# Patient Record
Sex: Female | Born: 1950 | Race: White | Hispanic: No | Marital: Married | Smoking: Never smoker
Health system: Southern US, Community
[De-identification: ages and names within clinical notes are randomized; demographics above are authoritative.]

## PROBLEM LIST (undated history)

## (undated) DIAGNOSIS — F32A Depression, unspecified: Secondary | ICD-10-CM

## (undated) DIAGNOSIS — T8859XA Other complications of anesthesia, initial encounter: Secondary | ICD-10-CM

## (undated) DIAGNOSIS — I10 Essential (primary) hypertension: Secondary | ICD-10-CM

## (undated) DIAGNOSIS — E119 Type 2 diabetes mellitus without complications: Secondary | ICD-10-CM

## (undated) DIAGNOSIS — N189 Chronic kidney disease, unspecified: Secondary | ICD-10-CM

## (undated) DIAGNOSIS — H269 Unspecified cataract: Secondary | ICD-10-CM

## (undated) DIAGNOSIS — C801 Malignant (primary) neoplasm, unspecified: Secondary | ICD-10-CM

## (undated) DIAGNOSIS — F419 Anxiety disorder, unspecified: Secondary | ICD-10-CM

## (undated) DIAGNOSIS — J189 Pneumonia, unspecified organism: Secondary | ICD-10-CM

## (undated) DIAGNOSIS — T4145XA Adverse effect of unspecified anesthetic, initial encounter: Secondary | ICD-10-CM

## (undated) DIAGNOSIS — K219 Gastro-esophageal reflux disease without esophagitis: Secondary | ICD-10-CM

## (undated) DIAGNOSIS — M1990 Unspecified osteoarthritis, unspecified site: Secondary | ICD-10-CM

## (undated) HISTORY — PX: CERVICAL DISC SURGERY: SHX588

## (undated) HISTORY — PX: CHOLECYSTECTOMY: SHX55

## (undated) HISTORY — PX: TONSILLECTOMY: SUR1361

## (undated) HISTORY — PX: OTHER SURGICAL HISTORY: SHX169

## (undated) HISTORY — PX: TUBAL LIGATION: SHX77

## (undated) HISTORY — DX: Malignant (primary) neoplasm, unspecified: C80.1

## (undated) HISTORY — DX: Unspecified cataract: H26.9

## (undated) HISTORY — DX: Gastro-esophageal reflux disease without esophagitis: K21.9

## (undated) HISTORY — PX: ABDOMINAL HYSTERECTOMY: SHX81

## (undated) HISTORY — DX: Depression, unspecified: F32.A

---

## 2004-02-07 HISTORY — PX: CARPAL TUNNEL RELEASE: SHX101

## 2006-12-17 ENCOUNTER — Inpatient Hospital Stay (HOSPITAL_COMMUNITY): Admission: RE | Admit: 2006-12-17 | Discharge: 2006-12-19 | Payer: Self-pay | Admitting: Neurosurgery

## 2007-11-14 ENCOUNTER — Encounter: Admission: RE | Admit: 2007-11-14 | Discharge: 2007-11-14 | Payer: Self-pay | Admitting: Neurosurgery

## 2007-12-30 ENCOUNTER — Ambulatory Visit (HOSPITAL_COMMUNITY): Admission: RE | Admit: 2007-12-30 | Discharge: 2007-12-31 | Payer: Self-pay | Admitting: Neurosurgery

## 2008-07-22 ENCOUNTER — Encounter: Admission: RE | Admit: 2008-07-22 | Discharge: 2008-07-22 | Payer: Self-pay | Admitting: Neurosurgery

## 2009-02-04 ENCOUNTER — Encounter: Admission: RE | Admit: 2009-02-04 | Discharge: 2009-02-04 | Payer: Self-pay | Admitting: Neurological Surgery

## 2009-04-22 HISTORY — PX: ESOPHAGOGASTRODUODENOSCOPY: SHX1529

## 2009-04-22 HISTORY — PX: COLONOSCOPY: SHX174

## 2010-06-21 NOTE — Op Note (Signed)
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   PROCEDURE:  C5-C7 posterior cervical arthrodesis with Oasis posterior  instrumentation, Actifuse, and Infuse.   SURGEON:  Hewitt Shorts, MD   ASSISTANTS:  Nelia Shi. Webb Silversmith, NP and Stefani Dama, MD   ANESTHESIA:  General endotracheal.   INDICATION:  The patient is a 60 year old woman, status post 3-level  anterior cervical diskectomy with fusion.  She has developed a good  fusion at C4-5, but nonunion and pseudoarthrosis at C5-6 and C6-7.  She  is having posterior neck pain with some discomfort extending down  towards her right shoulder.  The decision was made to proceed with a 2-  level posterior cervical arthrodesis.   PROCEDURE:  The patient was brought to the operating room, placed under  general endotracheal anesthesia.  The patient was placed on 3-pin  Mayfield head holder and turned to a prone position.  The back of the  neck was shaved and then the occipital, cervical, and upper thoracic  region were prepped with Betadine soap and solution, and draped in a  sterile fashion.  The midline incision was infiltrated with local  anesthetic with epinephrine.  The midline incision was made over the mid-  to-lower cervical spine.  A dissection was carried down to the  subcutaneous tissue.  Bipolar cautery and electrocautery was used to  maintain hemostasis.  Dissection was carried down to the cervical  fascia, which was incised bilaterally and the paraspinal  musculature was  dissected from the spinous process and lamina in a subperiosteal  fashion.  Self-retaining retractor was placed.  X-rays were taken and  the C5, C6, and C7 spinous process and lamina were identified.  With  magnification using microdissection and microsurgical technique, the  dissection was carried out laterally exposing the C5-6 and C6-7 facets.  There was no motion at C4-5, but there was notable motion at both C5-6  and C6-7.  We identified the lateral masses and then started drill holes  at each level bilaterally with an awl.  We then draped the C-arm  fluoroscope and using C-arm fluoroscopy, we were able to drill holes in  the lateral masses bilaterally at C5, C6, and C7, traversing them from  inferomedial to superolateral.  Each drill hole was examined with a ball  probe.  Good bony surfaces were noted on all surfaces at each of the  holes.  Each of the holes was then tapped and then we placed 12-mm  screws bilaterally at each level.  Once all 6 screws were in place, we  selected 40-mm rods.  They were lordosed  to the cervical lordosis and  then placed within the screw heads and secured with locking caps, which  were subsequently tightened against the counter torque.  We then  decorticated the lamina and spinous process of C5, C6, and C7 as well as  the fact joints at C5-6 and C6-7 bilaterally.  We then placed pledget of  Infuse over the lamina and facet joints bilaterally and then packed a 10-  mL volume of Actifuse, but splitting into half and placing about 5 mL on  each side.  Once the fusion construct was completed, we proceeded with  closure.  The deep fascia was closed with undyed 0 Vicryl sutures.  The  Scarpa, subcutaneous, and subcuticular were closed with interrupted  inverted 2-0 undyed Vicryl sutures.  The skin was closed with surgical  staples.  The wound was dressed with Adaptic, sterile gauze, and  Hypafix.  The procedure was tolerated well.  The  estimated  blood loss was 50 mL.  Sponge count was correct.  Following surgery, the  patient was turned back to the supine position.  The 3-pin Mayfield head  holder was removed.  The patient was reversed from the anesthetic,  extubated, and transferred to the recovery room for further care where  she was noted be moving all 4 extremities to command.      Hewitt Shorts, M.D.  Electronically Signed     RWN/MEDQ  D:  12/30/2007  T:  12/31/2007  Job:  [id number]

## 2010-06-21 NOTE — Op Note (Signed)
Paula Graves, Paula Graves NO.:  [id number]   MEDICAL RECORD NO.:  [id number]          PATIENT TYPE:  INP   LOCATION:  3013                         FACILITY:  [facility]   PHYSICIAN:  Hewitt Shorts, M.D.DATE OF BIRTH:  [birth year]   DATE OF PROCEDURE:  12/17/2006  DATE OF DISCHARGE:                               OPERATIVE REPORT   PREOPERATIVE DIAGNOSES:  1. Cervical stenosis.  2. Cervical spondylosis.  3. Cervical degenerative disk disease.  4. Cervical listhesis.   POSTOPERATIVE DIAGNOSES:  1. Cervical stenosis.  2. Cervical spondylosis.  3. Cervical degenerative disk disease.  4. Cervical listhesis.   PROCEDURE:  C4-5, C5-6 and C6-7 anterior cervical diskectomy arthrodesis  with allograft and tether cervical plating.   SURGEON:  Hewitt Shorts, M.D.   ASSISTANT:  Nelia Shi. Webb Silversmith, RN, and Coletta Memos, M.D.   ANESTHESIA:  General endotracheal.   INDICATIONS:  The patient is a 60 year old woman who presented with neck  pain and cervical radicular pain.  She was shown to have multilevel  degenerative changes at C4-5, C5-6 and C6-7.  The decision was made to  proceed with decompression and arthrodesis.   PROCEDURE:  The patient was brought to the operating room and placed  under general endotracheal anesthesia.  The patient was placed in 10  pounds of Holter traction.  The neck was prepped with Betadine soap  solution and draped in sterile fashion.  An oblique incision was made on  the left side of the neck paralleling the anterior border of the left  sternocleidomastoid.  The line of the incision was infiltrated with  local anesthetic with epinephrine.  Dissection was carried down through  the subcutaneous tissue and platysma.  Bipolar cautery and  electrocautery were used to maintain hemostasis.  Dissection was then  carried out through the avascular plane to the sternocleidomastoid,  carotid artery and jugular vein laterally, the trachea and  esophagus  medially.  The ventral aspect of the vertebral column was identified and  localizing X-ray were taken.  The C4-5, C5-6 and C6-7 levels identified.  Diskectomy was begun with incision of the anulus, continuing with  microcurettes and pituitary rongeurs.  The microscope was draped and  brought into the field to provide additional magnification, illumination  and visualization.  The rest of the decompression was performed using  microdissection and microsurgical technique.  Anterior osteophytic  overgrowth was removed using Leksell rongeur, osteophyte removal tool  and the ExMax drill.  We then continued the diskectomy posteriorly at  each level.  The cartilaginous endplates were removed using  microcurettes along with the ExMax drill.  Then posterior osteophytic  overgrowth, which was prominent at each level, was carefully removed  using the ExMax drill and a 2-mm Kerrison punch with a thin footplate.  The posterior longitudinal ligament was thickened at each level and this  was carefully removed as well.  We were able to decompress the spinal  canal and thecal sac.   We then turned our attention to the neural foraminal ensuring that each  of the neural foramina and the exiting  nerve roots were decompressed,  removing the spinal gutter growth encroaching on the neural foramina.  Once the neural foraminal and nerve roots were decompressed, hemostasis  was established with the use of Gelfoam soaked in thrombin.  We then  measured the height of the intervertebral disk spaces and selected three  7 mm in height grafts.  Each graft was hydrated in saline solution and  then we carefully positioned the intervertebral grafts at each level.  Once all three grafts were in place, we selected a 49-mm tether cervical  plate that was positioned over the fusion construct and secured to the  vertebra with 4 x 14 mm variable angle screws, placing a pair of screws  at C4, a pair of screws at C7,  single screws at C5 and C6.   Once the plate was secured and all 6 screws were finally tightened and x-  rays taken, we visualized only the C4 and C5 levels.  Screws were in  good position.  Her shoulders obscured vision at the other levels, but  under direct visualization they appeared in good position.  The wound  was irrigated with bacitracin solution, checked for hemostasis which was  established and confirmed and then we proceeded with closure.  The  platysma was closed with interrupted, inverted 2-0 Vicryl sutures, the  subcutaneous closed with inverted 3-0 Vicryl sutures.  The skin was  approximated with Dermabond.  The procedure was tolerated well.  The  estimated blood loss was 100 mL.  Sponge and needle counts were correct.  Following surgery the patient was placed in an Aspen cervical collar,  reversed from the anesthetic, extubated and transferred to the recovery  room for further care where she was noted to be moving all 4 extremities  to command.      Hewitt Shorts, M.D.  Electronically Signed     RWN/MEDQ  D:  12/17/2006  T:  12/17/2006  Job:  [id number]

## 2010-11-08 LAB — CBC
HCT: 42.3
Hemoglobin: 14.4
MCHC: 33.9
MCV: 93.6
Platelets: 270
RBC: 4.52
RDW: 12.5
WBC: 10.6 — ABNORMAL HIGH

## 2010-11-15 LAB — CBC
HCT: 41.8
Hemoglobin: 14.2
MCHC: 34
MCV: 92
Platelets: 297
RBC: 4.55
RDW: 12.8
WBC: 8.7

## 2014-09-09 DIAGNOSIS — Z0000 Encounter for general adult medical examination without abnormal findings: Secondary | ICD-10-CM | POA: Diagnosis not present

## 2014-09-10 DIAGNOSIS — Z23 Encounter for immunization: Secondary | ICD-10-CM | POA: Diagnosis not present

## 2014-09-10 DIAGNOSIS — E782 Mixed hyperlipidemia: Secondary | ICD-10-CM | POA: Diagnosis not present

## 2014-09-10 DIAGNOSIS — Z79899 Other long term (current) drug therapy: Secondary | ICD-10-CM | POA: Diagnosis not present

## 2014-09-18 DIAGNOSIS — Z1231 Encounter for screening mammogram for malignant neoplasm of breast: Secondary | ICD-10-CM | POA: Diagnosis not present

## 2014-11-25 DIAGNOSIS — Z23 Encounter for immunization: Secondary | ICD-10-CM | POA: Diagnosis not present

## 2015-05-21 DIAGNOSIS — J309 Allergic rhinitis, unspecified: Secondary | ICD-10-CM | POA: Diagnosis not present

## 2015-05-21 DIAGNOSIS — J0100 Acute maxillary sinusitis, unspecified: Secondary | ICD-10-CM | POA: Diagnosis not present

## 2015-05-24 DIAGNOSIS — J40 Bronchitis, not specified as acute or chronic: Secondary | ICD-10-CM | POA: Diagnosis not present

## 2015-05-24 DIAGNOSIS — J329 Chronic sinusitis, unspecified: Secondary | ICD-10-CM | POA: Diagnosis not present

## 2015-05-31 DIAGNOSIS — R062 Wheezing: Secondary | ICD-10-CM | POA: Diagnosis not present

## 2015-05-31 DIAGNOSIS — J209 Acute bronchitis, unspecified: Secondary | ICD-10-CM | POA: Diagnosis not present

## 2015-06-08 DIAGNOSIS — M7501 Adhesive capsulitis of right shoulder: Secondary | ICD-10-CM | POA: Diagnosis not present

## 2015-08-28 DIAGNOSIS — S91332A Puncture wound without foreign body, left foot, initial encounter: Secondary | ICD-10-CM | POA: Diagnosis not present

## 2015-10-20 DIAGNOSIS — E782 Mixed hyperlipidemia: Secondary | ICD-10-CM | POA: Diagnosis not present

## 2015-10-20 DIAGNOSIS — I10 Essential (primary) hypertension: Secondary | ICD-10-CM | POA: Diagnosis not present

## 2015-10-20 DIAGNOSIS — Z0000 Encounter for general adult medical examination without abnormal findings: Secondary | ICD-10-CM | POA: Diagnosis not present

## 2015-10-20 DIAGNOSIS — Z79899 Other long term (current) drug therapy: Secondary | ICD-10-CM | POA: Diagnosis not present

## 2015-10-20 DIAGNOSIS — E1165 Type 2 diabetes mellitus with hyperglycemia: Secondary | ICD-10-CM | POA: Diagnosis not present

## 2015-11-05 DIAGNOSIS — R030 Elevated blood-pressure reading, without diagnosis of hypertension: Secondary | ICD-10-CM | POA: Diagnosis not present

## 2015-11-05 DIAGNOSIS — M4722 Other spondylosis with radiculopathy, cervical region: Secondary | ICD-10-CM | POA: Diagnosis not present

## 2015-11-05 DIAGNOSIS — Z6827 Body mass index (BMI) 27.0-27.9, adult: Secondary | ICD-10-CM | POA: Diagnosis not present

## 2015-11-05 DIAGNOSIS — M542 Cervicalgia: Secondary | ICD-10-CM | POA: Diagnosis not present

## 2015-11-05 DIAGNOSIS — M5030 Other cervical disc degeneration, unspecified cervical region: Secondary | ICD-10-CM | POA: Diagnosis not present

## 2015-11-05 DIAGNOSIS — M5412 Radiculopathy, cervical region: Secondary | ICD-10-CM | POA: Diagnosis not present

## 2015-11-11 DIAGNOSIS — M4802 Spinal stenosis, cervical region: Secondary | ICD-10-CM | POA: Diagnosis not present

## 2015-11-11 DIAGNOSIS — M4722 Other spondylosis with radiculopathy, cervical region: Secondary | ICD-10-CM | POA: Diagnosis not present

## 2015-11-25 DIAGNOSIS — M4722 Other spondylosis with radiculopathy, cervical region: Secondary | ICD-10-CM | POA: Diagnosis not present

## 2015-11-25 DIAGNOSIS — M5020 Other cervical disc displacement, unspecified cervical region: Secondary | ICD-10-CM | POA: Diagnosis not present

## 2015-11-25 DIAGNOSIS — M5030 Other cervical disc degeneration, unspecified cervical region: Secondary | ICD-10-CM | POA: Diagnosis not present

## 2015-11-25 DIAGNOSIS — Z6827 Body mass index (BMI) 27.0-27.9, adult: Secondary | ICD-10-CM | POA: Diagnosis not present

## 2015-11-25 DIAGNOSIS — I10 Essential (primary) hypertension: Secondary | ICD-10-CM | POA: Diagnosis not present

## 2015-11-25 DIAGNOSIS — M5412 Radiculopathy, cervical region: Secondary | ICD-10-CM | POA: Diagnosis not present

## 2015-11-25 DIAGNOSIS — M542 Cervicalgia: Secondary | ICD-10-CM | POA: Diagnosis not present

## 2015-12-20 DIAGNOSIS — Z1231 Encounter for screening mammogram for malignant neoplasm of breast: Secondary | ICD-10-CM | POA: Diagnosis not present

## 2015-12-20 DIAGNOSIS — Z23 Encounter for immunization: Secondary | ICD-10-CM | POA: Diagnosis not present

## 2016-01-10 DIAGNOSIS — R928 Other abnormal and inconclusive findings on diagnostic imaging of breast: Secondary | ICD-10-CM | POA: Diagnosis not present

## 2016-01-28 DIAGNOSIS — M5030 Other cervical disc degeneration, unspecified cervical region: Secondary | ICD-10-CM | POA: Diagnosis not present

## 2016-01-28 DIAGNOSIS — M4722 Other spondylosis with radiculopathy, cervical region: Secondary | ICD-10-CM | POA: Diagnosis not present

## 2016-01-28 DIAGNOSIS — M5020 Other cervical disc displacement, unspecified cervical region: Secondary | ICD-10-CM | POA: Diagnosis not present

## 2016-01-28 DIAGNOSIS — M542 Cervicalgia: Secondary | ICD-10-CM | POA: Diagnosis not present

## 2016-01-28 DIAGNOSIS — M5412 Radiculopathy, cervical region: Secondary | ICD-10-CM | POA: Diagnosis not present

## 2016-03-14 DIAGNOSIS — M4722 Other spondylosis with radiculopathy, cervical region: Secondary | ICD-10-CM | POA: Diagnosis not present

## 2016-03-14 DIAGNOSIS — M5020 Other cervical disc displacement, unspecified cervical region: Secondary | ICD-10-CM | POA: Diagnosis not present

## 2016-03-14 DIAGNOSIS — M5030 Other cervical disc degeneration, unspecified cervical region: Secondary | ICD-10-CM | POA: Diagnosis not present

## 2016-03-14 DIAGNOSIS — I10 Essential (primary) hypertension: Secondary | ICD-10-CM | POA: Diagnosis not present

## 2016-03-14 DIAGNOSIS — M5412 Radiculopathy, cervical region: Secondary | ICD-10-CM | POA: Diagnosis not present

## 2016-03-14 DIAGNOSIS — M542 Cervicalgia: Secondary | ICD-10-CM | POA: Diagnosis not present

## 2016-03-14 DIAGNOSIS — M4802 Spinal stenosis, cervical region: Secondary | ICD-10-CM | POA: Diagnosis not present

## 2016-03-14 DIAGNOSIS — Z6827 Body mass index (BMI) 27.0-27.9, adult: Secondary | ICD-10-CM | POA: Diagnosis not present

## 2016-05-29 DIAGNOSIS — L820 Inflamed seborrheic keratosis: Secondary | ICD-10-CM | POA: Diagnosis not present

## 2016-05-29 DIAGNOSIS — C44722 Squamous cell carcinoma of skin of right lower limb, including hip: Secondary | ICD-10-CM | POA: Diagnosis not present

## 2016-05-29 DIAGNOSIS — L821 Other seborrheic keratosis: Secondary | ICD-10-CM | POA: Diagnosis not present

## 2016-05-29 DIAGNOSIS — D2271 Melanocytic nevi of right lower limb, including hip: Secondary | ICD-10-CM | POA: Diagnosis not present

## 2016-05-29 DIAGNOSIS — L578 Other skin changes due to chronic exposure to nonionizing radiation: Secondary | ICD-10-CM | POA: Diagnosis not present

## 2016-05-29 DIAGNOSIS — L570 Actinic keratosis: Secondary | ICD-10-CM | POA: Diagnosis not present

## 2016-10-12 DIAGNOSIS — E1165 Type 2 diabetes mellitus with hyperglycemia: Secondary | ICD-10-CM | POA: Diagnosis not present

## 2016-10-19 DIAGNOSIS — E119 Type 2 diabetes mellitus without complications: Secondary | ICD-10-CM | POA: Diagnosis not present

## 2016-10-19 DIAGNOSIS — K589 Irritable bowel syndrome without diarrhea: Secondary | ICD-10-CM | POA: Diagnosis not present

## 2016-10-19 DIAGNOSIS — Z23 Encounter for immunization: Secondary | ICD-10-CM | POA: Diagnosis not present

## 2016-11-16 DIAGNOSIS — E119 Type 2 diabetes mellitus without complications: Secondary | ICD-10-CM | POA: Diagnosis not present

## 2016-11-20 DIAGNOSIS — Z6825 Body mass index (BMI) 25.0-25.9, adult: Secondary | ICD-10-CM | POA: Diagnosis not present

## 2016-11-20 DIAGNOSIS — R300 Dysuria: Secondary | ICD-10-CM | POA: Diagnosis not present

## 2016-11-20 DIAGNOSIS — N12 Tubulo-interstitial nephritis, not specified as acute or chronic: Secondary | ICD-10-CM | POA: Diagnosis not present

## 2016-11-21 DIAGNOSIS — R300 Dysuria: Secondary | ICD-10-CM | POA: Diagnosis not present

## 2016-12-07 DIAGNOSIS — N2581 Secondary hyperparathyroidism of renal origin: Secondary | ICD-10-CM | POA: Diagnosis not present

## 2016-12-07 DIAGNOSIS — D631 Anemia in chronic kidney disease: Secondary | ICD-10-CM | POA: Diagnosis not present

## 2016-12-07 DIAGNOSIS — N183 Chronic kidney disease, stage 3 (moderate): Secondary | ICD-10-CM | POA: Diagnosis not present

## 2016-12-07 DIAGNOSIS — I10 Essential (primary) hypertension: Secondary | ICD-10-CM | POA: Diagnosis not present

## 2016-12-12 ENCOUNTER — Other Ambulatory Visit: Payer: Self-pay | Admitting: Neurosurgery

## 2016-12-12 DIAGNOSIS — M4722 Other spondylosis with radiculopathy, cervical region: Secondary | ICD-10-CM | POA: Diagnosis not present

## 2016-12-12 DIAGNOSIS — M5020 Other cervical disc displacement, unspecified cervical region: Secondary | ICD-10-CM | POA: Diagnosis not present

## 2016-12-12 DIAGNOSIS — M542 Cervicalgia: Secondary | ICD-10-CM | POA: Diagnosis not present

## 2016-12-12 DIAGNOSIS — M5030 Other cervical disc degeneration, unspecified cervical region: Secondary | ICD-10-CM | POA: Diagnosis not present

## 2016-12-12 DIAGNOSIS — M4802 Spinal stenosis, cervical region: Secondary | ICD-10-CM | POA: Diagnosis not present

## 2016-12-13 DIAGNOSIS — R300 Dysuria: Secondary | ICD-10-CM | POA: Diagnosis not present

## 2016-12-14 DIAGNOSIS — R300 Dysuria: Secondary | ICD-10-CM | POA: Diagnosis not present

## 2016-12-15 DIAGNOSIS — D631 Anemia in chronic kidney disease: Secondary | ICD-10-CM | POA: Diagnosis not present

## 2016-12-15 DIAGNOSIS — N2581 Secondary hyperparathyroidism of renal origin: Secondary | ICD-10-CM | POA: Diagnosis not present

## 2016-12-15 DIAGNOSIS — N281 Cyst of kidney, acquired: Secondary | ICD-10-CM | POA: Diagnosis not present

## 2016-12-15 DIAGNOSIS — N183 Chronic kidney disease, stage 3 (moderate): Secondary | ICD-10-CM | POA: Diagnosis not present

## 2016-12-15 DIAGNOSIS — I129 Hypertensive chronic kidney disease with stage 1 through stage 4 chronic kidney disease, or unspecified chronic kidney disease: Secondary | ICD-10-CM | POA: Diagnosis not present

## 2016-12-18 ENCOUNTER — Encounter (HOSPITAL_COMMUNITY): Payer: Self-pay | Admitting: *Deleted

## 2016-12-18 ENCOUNTER — Other Ambulatory Visit: Payer: Self-pay

## 2016-12-18 ENCOUNTER — Encounter (HOSPITAL_COMMUNITY)
Admission: RE | Admit: 2016-12-18 | Discharge: 2016-12-18 | Disposition: A | Discharge status: Home / Self Care | Payer: Medicare Other | Source: Ambulatory Visit | Attending: Neurosurgery | Admitting: Neurosurgery

## 2016-12-18 DIAGNOSIS — E119 Type 2 diabetes mellitus without complications: Secondary | ICD-10-CM | POA: Insufficient documentation

## 2016-12-18 DIAGNOSIS — I10 Essential (primary) hypertension: Secondary | ICD-10-CM | POA: Insufficient documentation

## 2016-12-18 DIAGNOSIS — M1990 Unspecified osteoarthritis, unspecified site: Secondary | ICD-10-CM

## 2016-12-18 DIAGNOSIS — M5022 Other cervical disc displacement, mid-cervical region, unspecified level: Secondary | ICD-10-CM | POA: Insufficient documentation

## 2016-12-18 DIAGNOSIS — N189 Chronic kidney disease, unspecified: Secondary | ICD-10-CM | POA: Diagnosis not present

## 2016-12-18 DIAGNOSIS — E1122 Type 2 diabetes mellitus with diabetic chronic kidney disease: Secondary | ICD-10-CM | POA: Diagnosis not present

## 2016-12-18 DIAGNOSIS — Z7984 Long term (current) use of oral hypoglycemic drugs: Secondary | ICD-10-CM | POA: Insufficient documentation

## 2016-12-18 DIAGNOSIS — Z01810 Encounter for preprocedural cardiovascular examination: Secondary | ICD-10-CM | POA: Insufficient documentation

## 2016-12-18 DIAGNOSIS — F419 Anxiety disorder, unspecified: Secondary | ICD-10-CM | POA: Diagnosis not present

## 2016-12-18 DIAGNOSIS — M5011 Cervical disc disorder with radiculopathy,  high cervical region: Secondary | ICD-10-CM | POA: Diagnosis not present

## 2016-12-18 DIAGNOSIS — Z01812 Encounter for preprocedural laboratory examination: Secondary | ICD-10-CM

## 2016-12-18 DIAGNOSIS — I129 Hypertensive chronic kidney disease with stage 1 through stage 4 chronic kidney disease, or unspecified chronic kidney disease: Secondary | ICD-10-CM | POA: Diagnosis not present

## 2016-12-18 DIAGNOSIS — N289 Disorder of kidney and ureter, unspecified: Secondary | ICD-10-CM

## 2016-12-18 DIAGNOSIS — Z79899 Other long term (current) drug therapy: Secondary | ICD-10-CM | POA: Diagnosis not present

## 2016-12-18 HISTORY — DX: Pneumonia, unspecified organism: J18.9

## 2016-12-18 HISTORY — DX: Type 2 diabetes mellitus without complications: E11.9

## 2016-12-18 HISTORY — DX: Essential (primary) hypertension: I10

## 2016-12-18 HISTORY — DX: Unspecified osteoarthritis, unspecified site: M19.90

## 2016-12-18 HISTORY — DX: Chronic kidney disease, unspecified: N18.9

## 2016-12-18 HISTORY — DX: Adverse effect of unspecified anesthetic, initial encounter: T41.45XA

## 2016-12-18 HISTORY — DX: Anxiety disorder, unspecified: F41.9

## 2016-12-18 HISTORY — DX: Other complications of anesthesia, initial encounter: T88.59XA

## 2016-12-18 LAB — CBC
HCT: 36.2 % (ref 36.0–46.0)
Hemoglobin: 11.8 g/dL — ABNORMAL LOW (ref 12.0–15.0)
MCH: 30.5 pg (ref 26.0–34.0)
MCHC: 32.6 g/dL (ref 30.0–36.0)
MCV: 93.5 fL (ref 78.0–100.0)
PLATELETS: 335 10*3/uL (ref 150–400)
RBC: 3.87 MIL/uL (ref 3.87–5.11)
RDW: 12.8 % (ref 11.5–15.5)
WBC: 8 10*3/uL (ref 4.0–10.5)

## 2016-12-18 LAB — BASIC METABOLIC PANEL
Anion gap: 9 (ref 5–15)
BUN: 15 mg/dL (ref 6–20)
CO2: 27 mmol/L (ref 22–32)
Calcium: 9.8 mg/dL (ref 8.9–10.3)
Chloride: 103 mmol/L (ref 101–111)
Creatinine, Ser: 1.57 mg/dL — ABNORMAL HIGH (ref 0.44–1.00)
GFR calc Af Amer: 38 mL/min — ABNORMAL LOW (ref >60)
GFR, EST NON AFRICAN AMERICAN: 33 mL/min — AB (ref >60)
Glucose, Bld: 157 mg/dL — ABNORMAL HIGH (ref 65–99)
POTASSIUM: 4.1 mmol/L (ref 3.5–5.1)
SODIUM: 139 mmol/L (ref 135–145)

## 2016-12-18 LAB — TYPE AND SCREEN
ABO/RH(D): A POS
Antibody Screen: NEGATIVE

## 2016-12-18 LAB — GLUCOSE, CAPILLARY: Glucose-Capillary: 185 mg/dL — ABNORMAL HIGH (ref 65–99)

## 2016-12-18 LAB — HEMOGLOBIN A1C
Hgb A1c MFr Bld: 6.7 % — ABNORMAL HIGH (ref 4.8–5.6)
MEAN PLASMA GLUCOSE: 145.59 mg/dL

## 2016-12-18 LAB — SURGICAL PCR SCREEN
MRSA, PCR: NEGATIVE
Staphylococcus aureus: NEGATIVE

## 2016-12-18 LAB — ABO/RH: ABO/RH(D): A POS

## 2016-12-18 NOTE — Pre-Procedure Instructions (Addendum)
Paula Graves  12/18/2016      [facility] [place], [place] - [address] AT [address] [place] [address] Phone: [phone] Fax: [phone]    Your procedure is scheduled on 12/20/16.  Report to [facility] at [place].M.  Call this number if you have problems the morning of surgery:  [phone]   Remember:  Do not eat food or drink liquids after midnight.  Take these medicines the morning of surgery with A SIP OF WATER        Venlafaxine(effexor), lorazepam(ativan) if needed, flexeril if needed, imitrex if needed  STOP all herbel meds, nsaids (aleve,naproxen,advil,ibuprofen) prior to surgery starting today 12/18/16 including all vitamins/supplements,aspirin ,fish oil,osteo-biflex   Do not wear jewelry, make-up or nail polish.  Do not wear lotions, powders, or perfumes, or deoderant.  Do not shave 48 hours prior to surgery.  Men may shave face and neck.  Do not bring valuables to the hospital.  [facility] is not responsible for any belongings or valuables.  Contacts, dentures or bridgework may not be worn into surgery.  Leave your suitcase in the car.  After surgery it may be brought to your room.  For patients admitted to the hospital, discharge time will be determined by your treatment team.  Patients discharged the day of surgery will not be allowed to drive home.   Special instructions:   Special Instructions: [place] - Preparing for Surgery  Before surgery, you can play an important role.  Because skin is not sterile, your skin needs to be as free of germs as possible.  You can reduce the number of germs on you skin by washing with CHG (chlorahexidine gluconate) soap before surgery.  CHG is an antiseptic cleaner which kills germs and bonds with the skin to continue killing germs even after washing.  Please DO NOT use if you have an allergy to CHG or antibacterial  soaps.  If your skin becomes reddened/irritated stop using the CHG and inform your nurse when you arrive at Short Stay.  Do not shave (including legs and underarms) for at least 48 hours prior to the first CHG shower.  You may shave your face.  Please follow these instructions carefully:   1.  Shower with CHG Soap the night before surgery and the morning of Surgery.  2.  If you choose to wash your hair, wash your hair first as usual with your normal shampoo.  3.  After you shampoo, rinse your hair and body thoroughly to remove the Shampoo.  4.  Use CHG as you would any other liquid soap.  You can apply chg directly  to the skin and wash gently with scrungie or a clean washcloth.  5.  Apply the CHG Soap to your body ONLY FROM THE NECK DOWN.  Do not use on open wounds or open sores.  Avoid contact with your eyes ears, mouth and genitals (private parts).  Wash genitals (private parts)       with your normal soap.  6.  Wash thoroughly, paying special attention to the area where your surgery will be performed.  7.  Thoroughly rinse your body with warm water from the neck down.  8.  DO NOT shower/wash with your normal soap after using and rinsing off the CHG Soap.  9.  Pat yourself dry with a clean towel.  10.  Wear clean pajamas.            11.  Place clean sheets on your bed the night of your first shower and do not sleep with pets.  Day of Surgery  Do not apply any lotions/deodorants the morning of surgery.  Please wear clean clothes to the hospital/surgery center.  Please read over the  fact sheets that you were given.

## 2016-12-19 NOTE — Progress Notes (Signed)
Anesthesia Chart Review:  Pt is a 66 year old female scheduled for partial removal tethered cervical plate, ACDF C2-3, C3-4 on 12/20/2016 with Jovita Gamma, M.D.  - Nephrologist is Elmarie Shiley, MD. Last office visit 12/07/16. Notes indicate Dr. Posey Pronto took pt off of metformin and HCTZ in September due to worsening renal function and did not replace them with alternative treatments.  Dr. Sherwood Gambler is aware per [facility] in his office.   PMH includes: HTN, DM, CKD. Never smoker. BMI 27.  Medications include: Simvastatin  BP (!) 180/89   Pulse 81   Temp 36.8 C (Oral)   Resp 18   Ht 5\' 2"  (1.575 m)   Wt 148 lb 2 oz (67.2 kg)   SpO2 100%   BMI 27.09 kg/m   Preoperative labs reviewed.   - HbA1c 6.7, glucose 157  EKG 12/18/16: NSR  Renal ultrasound 12/15/16 [facility]):  1. Question mild cortical thinning at the upper poles of both kidneys. No hydronephrosis 2. 2.8cm simple right parapelvic cyst, similar to previous CT from 2014.   If BP and blood glucose are acceptable day of surgery, I anticipate pt can proceed as scheduled.   Willeen Cass, FNP-BC [facility] Short Stay Surgical Center/Anesthesiology Phone: [phone] 12/19/2016 3:16 PM

## 2016-12-20 ENCOUNTER — Ambulatory Visit (HOSPITAL_COMMUNITY): Payer: Medicare Other | Admitting: Emergency Medicine

## 2016-12-20 ENCOUNTER — Observation Stay (HOSPITAL_COMMUNITY)
Admission: RE | Admit: 2016-12-20 | Discharge: 2016-12-21 | Disposition: A | Discharge status: Home / Self Care | Payer: Medicare Other | Source: Ambulatory Visit | Attending: Neurosurgery | Admitting: Neurosurgery

## 2016-12-20 ENCOUNTER — Encounter (HOSPITAL_COMMUNITY)
Admission: RE | Disposition: A | Discharge status: Home / Self Care | Payer: Self-pay | Source: Ambulatory Visit | Attending: Neurosurgery

## 2016-12-20 ENCOUNTER — Observation Stay (HOSPITAL_COMMUNITY): Payer: Medicare Other

## 2016-12-20 ENCOUNTER — Encounter (HOSPITAL_COMMUNITY): Payer: Self-pay | Admitting: *Deleted

## 2016-12-20 DIAGNOSIS — F419 Anxiety disorder, unspecified: Secondary | ICD-10-CM | POA: Diagnosis not present

## 2016-12-20 DIAGNOSIS — I10 Essential (primary) hypertension: Secondary | ICD-10-CM | POA: Diagnosis not present

## 2016-12-20 DIAGNOSIS — N189 Chronic kidney disease, unspecified: Secondary | ICD-10-CM | POA: Diagnosis not present

## 2016-12-20 DIAGNOSIS — E119 Type 2 diabetes mellitus without complications: Secondary | ICD-10-CM | POA: Diagnosis not present

## 2016-12-20 DIAGNOSIS — E1122 Type 2 diabetes mellitus with diabetic chronic kidney disease: Secondary | ICD-10-CM | POA: Insufficient documentation

## 2016-12-20 DIAGNOSIS — Z79899 Other long term (current) drug therapy: Secondary | ICD-10-CM | POA: Insufficient documentation

## 2016-12-20 DIAGNOSIS — Z419 Encounter for procedure for purposes other than remedying health state, unspecified: Secondary | ICD-10-CM

## 2016-12-20 DIAGNOSIS — M1990 Unspecified osteoarthritis, unspecified site: Secondary | ICD-10-CM | POA: Diagnosis not present

## 2016-12-20 DIAGNOSIS — M4722 Other spondylosis with radiculopathy, cervical region: Secondary | ICD-10-CM | POA: Diagnosis not present

## 2016-12-20 DIAGNOSIS — M5020 Other cervical disc displacement, unspecified cervical region: Secondary | ICD-10-CM | POA: Diagnosis present

## 2016-12-20 DIAGNOSIS — M5011 Cervical disc disorder with radiculopathy,  high cervical region: Secondary | ICD-10-CM | POA: Diagnosis not present

## 2016-12-20 DIAGNOSIS — I129 Hypertensive chronic kidney disease with stage 1 through stage 4 chronic kidney disease, or unspecified chronic kidney disease: Secondary | ICD-10-CM | POA: Insufficient documentation

## 2016-12-20 DIAGNOSIS — Z981 Arthrodesis status: Secondary | ICD-10-CM | POA: Diagnosis not present

## 2016-12-20 DIAGNOSIS — M5021 Other cervical disc displacement,  high cervical region: Secondary | ICD-10-CM | POA: Diagnosis not present

## 2016-12-20 HISTORY — PX: ANTERIOR CERVICAL DECOMP/DISCECTOMY FUSION: SHX1161

## 2016-12-20 LAB — GLUCOSE, CAPILLARY
Glucose-Capillary: 167 mg/dL — ABNORMAL HIGH (ref 65–99)
Glucose-Capillary: 232 mg/dL — ABNORMAL HIGH (ref 65–99)
Glucose-Capillary: 262 mg/dL — ABNORMAL HIGH (ref 65–99)
Glucose-Capillary: 214 mg/dL — ABNORMAL HIGH (ref 65–99)

## 2016-12-20 SURGERY — ANTERIOR CERVICAL DECOMPRESSION/DISCECTOMY FUSION 1 LEVEL/HARDWARE REMOVAL
Anesthesia: General | Site: Neck

## 2016-12-20 MED ORDER — LACTATED RINGERS IV SOLN
INTRAVENOUS | Status: DC | PRN
  Administered 2016-12-20 (×2): via INTRAVENOUS

## 2016-12-20 MED ORDER — BISACODYL 10 MG RE SUPP: 10.0000 mg | Freq: Every day | RECTAL | Status: DC | PRN

## 2016-12-20 MED ORDER — PROPOFOL 10 MG/ML IV BOLUS
INTRAVENOUS | Status: DC | PRN
  Administered 2016-12-20: 150 mg via INTRAVENOUS

## 2016-12-20 MED ORDER — LABETALOL HCL 5 MG/ML IV SOLN
INTRAVENOUS | Status: AC
  Filled 2016-12-20: qty 4

## 2016-12-20 MED ORDER — PROPOFOL 10 MG/ML IV BOLUS
INTRAVENOUS | Status: AC
  Filled 2016-12-20: qty 20

## 2016-12-20 MED ORDER — INSULIN ASPART 100 UNIT/ML ~~LOC~~ SOLN
0.0000 [IU] | Freq: Every day | SUBCUTANEOUS | Status: DC
  Administered 2016-12-20: 2 [IU] via SUBCUTANEOUS

## 2016-12-20 MED ORDER — HYDROCODONE-ACETAMINOPHEN 5-325 MG PO TABS
1.0000 | ORAL_TABLET | ORAL | Status: DC | PRN
  Administered 2016-12-20 (×2): 1 via ORAL
  Administered 2016-12-21 (×4): 2 via ORAL
  Filled 2016-12-20: qty 1
  Filled 2016-12-20: qty 2
  Filled 2016-12-20: qty 1
  Filled 2016-12-20 (×3): qty 2

## 2016-12-20 MED ORDER — ACETAMINOPHEN 650 MG RE SUPP: 650.0000 mg | RECTAL | Status: DC | PRN

## 2016-12-20 MED ORDER — SODIUM CHLORIDE 0.9% FLUSH: 3.0000 mL | INTRAVENOUS | Status: DC | PRN

## 2016-12-20 MED ORDER — VENLAFAXINE HCL 75 MG PO TABS
75.0000 mg | ORAL_TABLET | Freq: Three times a day (TID) | ORAL | Status: DC
  Administered 2016-12-20 – 2016-12-21 (×3): 75 mg via ORAL
  Filled 2016-12-20 (×5): qty 1

## 2016-12-20 MED ORDER — CEFAZOLIN SODIUM-DEXTROSE 2-4 GM/100ML-% IV SOLN
INTRAVENOUS | Status: AC
  Filled 2016-12-20: qty 100

## 2016-12-20 MED ORDER — KCL IN DEXTROSE-NACL 20-5-0.45 MEQ/L-%-% IV SOLN
INTRAVENOUS | Status: AC
  Filled 2016-12-20: qty 1000

## 2016-12-20 MED ORDER — THROMBIN (RECOMBINANT) 5000 UNITS EX SOLR
CUTANEOUS | Status: AC
  Filled 2016-12-20: qty 10000

## 2016-12-20 MED ORDER — VITAMIN C 500 MG PO TABS
1000.0000 mg | ORAL_TABLET | Freq: Every day | ORAL | Status: DC
  Administered 2016-12-21: 1000 mg via ORAL
  Filled 2016-12-20 (×2): qty 2

## 2016-12-20 MED ORDER — ONDANSETRON HCL 4 MG PO TABS
4.0000 mg | ORAL_TABLET | Freq: Four times a day (QID) | ORAL | Status: DC | PRN
  Administered 2016-12-20: 4 mg via ORAL
  Filled 2016-12-20: qty 1

## 2016-12-20 MED ORDER — DICYCLOMINE HCL 20 MG PO TABS
20.0000 mg | ORAL_TABLET | Freq: Four times a day (QID) | ORAL | Status: DC | PRN
  Filled 2016-12-20: qty 1

## 2016-12-20 MED ORDER — MAGNESIUM HYDROXIDE 400 MG/5ML PO SUSP: 30.0000 mL | Freq: Every day | ORAL | Status: DC | PRN

## 2016-12-20 MED ORDER — PHENYLEPHRINE 40 MCG/ML (10ML) SYRINGE FOR IV PUSH (FOR BLOOD PRESSURE SUPPORT)
PREFILLED_SYRINGE | INTRAVENOUS | Status: AC
  Filled 2016-12-20: qty 10

## 2016-12-20 MED ORDER — PROMETHAZINE HCL 25 MG/ML IJ SOLN: 6.2500 mg | INTRAMUSCULAR | Status: DC | PRN

## 2016-12-20 MED ORDER — OXYCODONE HCL 5 MG/5ML PO SOLN: 5.0000 mg | Freq: Once | ORAL | Status: DC | PRN

## 2016-12-20 MED ORDER — OXYCODONE HCL 5 MG PO TABS
ORAL_TABLET | ORAL | Status: AC
  Filled 2016-12-20: qty 1

## 2016-12-20 MED ORDER — DEXAMETHASONE SODIUM PHOSPHATE 10 MG/ML IJ SOLN
INTRAMUSCULAR | Status: DC | PRN
  Administered 2016-12-20: 5 mg via INTRAVENOUS

## 2016-12-20 MED ORDER — ALUM & MAG HYDROXIDE-SIMETH 200-200-20 MG/5ML PO SUSP: 30.0000 mL | Freq: Four times a day (QID) | ORAL | Status: DC | PRN

## 2016-12-20 MED ORDER — ACETAMINOPHEN 10 MG/ML IV SOLN
INTRAVENOUS | Status: DC | PRN
  Administered 2016-12-20: 1000 mg via INTRAVENOUS

## 2016-12-20 MED ORDER — BUPIVACAINE HCL (PF) 0.5 % IJ SOLN
INTRAMUSCULAR | Status: DC | PRN
  Administered 2016-12-20: 5 mL

## 2016-12-20 MED ORDER — DEXAMETHASONE SODIUM PHOSPHATE 10 MG/ML IJ SOLN
INTRAMUSCULAR | Status: AC
  Filled 2016-12-20: qty 3

## 2016-12-20 MED ORDER — ROCURONIUM BROMIDE 10 MG/ML (PF) SYRINGE
PREFILLED_SYRINGE | INTRAVENOUS | Status: DC | PRN
  Administered 2016-12-20: 50 mg via INTRAVENOUS

## 2016-12-20 MED ORDER — KCL IN DEXTROSE-NACL 20-5-0.45 MEQ/L-%-% IV SOLN
INTRAVENOUS | Status: DC
  Administered 2016-12-20: 100 mL/h via INTRAVENOUS

## 2016-12-20 MED ORDER — SODIUM CHLORIDE 0.9 % IJ SOLN
INTRAMUSCULAR | Status: AC
  Filled 2016-12-20: qty 10

## 2016-12-20 MED ORDER — MIDAZOLAM HCL 5 MG/5ML IJ SOLN
INTRAMUSCULAR | Status: DC | PRN
  Administered 2016-12-20: 2 mg via INTRAVENOUS

## 2016-12-20 MED ORDER — HYDROXYZINE HCL 50 MG/ML IM SOLN: 50.0000 mg | INTRAMUSCULAR | Status: DC | PRN

## 2016-12-20 MED ORDER — LIDOCAINE 2% (20 MG/ML) 5 ML SYRINGE
INTRAMUSCULAR | Status: DC | PRN
  Administered 2016-12-20: 80 mg via INTRAVENOUS

## 2016-12-20 MED ORDER — EPHEDRINE 5 MG/ML INJ
INTRAVENOUS | Status: AC
Start: 2016-12-20 — End: 2016-12-20
  Filled 2016-12-20: qty 10

## 2016-12-20 MED ORDER — ONDANSETRON HCL 4 MG/2ML IJ SOLN
INTRAMUSCULAR | Status: DC | PRN
  Administered 2016-12-20: 4 mg via INTRAVENOUS

## 2016-12-20 MED ORDER — OXYCODONE HCL 5 MG PO TABS: 5.0000 mg | ORAL_TABLET | Freq: Once | ORAL | Status: DC | PRN

## 2016-12-20 MED ORDER — SIMVASTATIN 40 MG PO TABS
40.0000 mg | ORAL_TABLET | Freq: Every day | ORAL | Status: DC
  Administered 2016-12-20: 40 mg via ORAL
  Filled 2016-12-20: qty 2
  Filled 2016-12-20 (×2): qty 1

## 2016-12-20 MED ORDER — ROCURONIUM BROMIDE 10 MG/ML (PF) SYRINGE
PREFILLED_SYRINGE | INTRAVENOUS | Status: AC
  Filled 2016-12-20: qty 25

## 2016-12-20 MED ORDER — ACETAMINOPHEN 325 MG PO TABS: 650.0000 mg | ORAL_TABLET | ORAL | Status: DC | PRN

## 2016-12-20 MED ORDER — HEMOSTATIC AGENTS (NO CHARGE) OPTIME
TOPICAL | Status: DC | PRN
  Administered 2016-12-20 (×2): 1 via TOPICAL

## 2016-12-20 MED ORDER — THROMBIN (RECOMBINANT) 5000 UNITS EX SOLR
CUTANEOUS | Status: DC | PRN
  Administered 2016-12-20 (×3): 5000 [IU] via TOPICAL

## 2016-12-20 MED ORDER — CHLORHEXIDINE GLUCONATE CLOTH 2 % EX PADS: 6.0000 | MEDICATED_PAD | Freq: Once | CUTANEOUS | Status: DC

## 2016-12-20 MED ORDER — MIDAZOLAM HCL 2 MG/2ML IJ SOLN
INTRAMUSCULAR | Status: AC
  Filled 2016-12-20: qty 2

## 2016-12-20 MED ORDER — MORPHINE SULFATE (PF) 4 MG/ML IV SOLN
4.0000 mg | INTRAVENOUS | Status: DC | PRN
  Administered 2016-12-20: 4 mg via INTRAMUSCULAR
  Filled 2016-12-20: qty 1

## 2016-12-20 MED ORDER — ONDANSETRON HCL 4 MG/2ML IJ SOLN
4.0000 mg | Freq: Four times a day (QID) | INTRAMUSCULAR | Status: DC | PRN
  Filled 2016-12-20: qty 2

## 2016-12-20 MED ORDER — PHENOL 1.4 % MT LIQD
1.0000 | OROMUCOSAL | Status: DC | PRN
  Administered 2016-12-20: 1 via OROMUCOSAL
  Filled 2016-12-20: qty 177

## 2016-12-20 MED ORDER — HYDROMORPHONE HCL 1 MG/ML IJ SOLN
INTRAMUSCULAR | Status: AC
  Administered 2016-12-20: 0.5 mg via INTRAVENOUS
  Filled 2016-12-20: qty 1

## 2016-12-20 MED ORDER — 0.9 % SODIUM CHLORIDE (POUR BTL) OPTIME
TOPICAL | Status: DC | PRN
  Administered 2016-12-20: 1000 mL

## 2016-12-20 MED ORDER — LORAZEPAM 0.5 MG PO TABS: 1.0000 mg | ORAL_TABLET | Freq: Two times a day (BID) | ORAL | Status: DC | PRN

## 2016-12-20 MED ORDER — HYDROMORPHONE HCL 1 MG/ML IJ SOLN
0.2500 mg | INTRAMUSCULAR | Status: DC | PRN
  Administered 2016-12-20: 0.5 mg via INTRAVENOUS

## 2016-12-20 MED ORDER — BUPIVACAINE HCL (PF) 0.5 % IJ SOLN
INTRAMUSCULAR | Status: AC
  Filled 2016-12-20: qty 30

## 2016-12-20 MED ORDER — SODIUM CHLORIDE 0.9 % IV SOLN: 250.0000 mL | INTRAVENOUS | Status: DC

## 2016-12-20 MED ORDER — MENTHOL 3 MG MT LOZG: 1.0000 | LOZENGE | OROMUCOSAL | Status: DC | PRN

## 2016-12-20 MED ORDER — SUGAMMADEX SODIUM 200 MG/2ML IV SOLN
INTRAVENOUS | Status: DC | PRN
  Administered 2016-12-20: 100 mg via INTRAVENOUS

## 2016-12-20 MED ORDER — THROMBIN (RECOMBINANT) 5000 UNITS EX SOLR
CUTANEOUS | Status: AC
  Filled 2016-12-20: qty 5000

## 2016-12-20 MED ORDER — ONDANSETRON HCL 4 MG/2ML IJ SOLN
INTRAMUSCULAR | Status: AC
  Filled 2016-12-20: qty 8

## 2016-12-20 MED ORDER — ONDANSETRON HCL 4 MG/2ML IJ SOLN
INTRAMUSCULAR | Status: AC
  Filled 2016-12-20: qty 2

## 2016-12-20 MED ORDER — SODIUM CHLORIDE 0.9 % IR SOLN
Status: DC | PRN
  Administered 2016-12-20: 10:00:00

## 2016-12-20 MED ORDER — LABETALOL HCL 5 MG/ML IV SOLN
INTRAVENOUS | Status: DC | PRN
  Administered 2016-12-20: 5 mg via INTRAVENOUS
  Administered 2016-12-20: 10 mg via INTRAVENOUS

## 2016-12-20 MED ORDER — INSULIN ASPART 100 UNIT/ML ~~LOC~~ SOLN
0.0000 [IU] | Freq: Three times a day (TID) | SUBCUTANEOUS | Status: DC
  Administered 2016-12-21 (×2): 3 [IU] via SUBCUTANEOUS

## 2016-12-20 MED ORDER — FLEET ENEMA 7-19 GM/118ML RE ENEM: 1.0000 | ENEMA | Freq: Once | RECTAL | Status: DC | PRN

## 2016-12-20 MED ORDER — SUFENTANIL CITRATE 50 MCG/ML IV SOLN
INTRAVENOUS | Status: AC
  Filled 2016-12-20: qty 1

## 2016-12-20 MED ORDER — LIDOCAINE-EPINEPHRINE 1 %-1:100000 IJ SOLN
INTRAMUSCULAR | Status: AC
  Filled 2016-12-20: qty 1

## 2016-12-20 MED ORDER — CEFAZOLIN SODIUM-DEXTROSE 2-4 GM/100ML-% IV SOLN
2.0000 g | INTRAVENOUS | Status: AC
  Administered 2016-12-20: 2 g via INTRAVENOUS

## 2016-12-20 MED ORDER — SUFENTANIL CITRATE 50 MCG/ML IV SOLN
INTRAVENOUS | Status: DC | PRN
  Administered 2016-12-20: 20 ug via INTRAVENOUS

## 2016-12-20 MED ORDER — SUMATRIPTAN SUCCINATE 100 MG PO TABS
100.0000 mg | ORAL_TABLET | ORAL | Status: DC | PRN
  Filled 2016-12-20: qty 1

## 2016-12-20 MED ORDER — LIDOCAINE-EPINEPHRINE 1 %-1:100000 IJ SOLN
INTRAMUSCULAR | Status: DC | PRN
  Administered 2016-12-20: 5 mL

## 2016-12-20 MED ORDER — MEPERIDINE HCL 25 MG/ML IJ SOLN: 6.2500 mg | INTRAMUSCULAR | Status: DC | PRN

## 2016-12-20 MED ORDER — LIDOCAINE 2% (20 MG/ML) 5 ML SYRINGE
INTRAMUSCULAR | Status: AC
  Filled 2016-12-20: qty 5

## 2016-12-20 MED ORDER — SODIUM CHLORIDE 0.9% FLUSH: 3.0000 mL | Freq: Two times a day (BID) | INTRAVENOUS | Status: DC

## 2016-12-20 MED ORDER — HYDROXYZINE HCL 25 MG PO TABS: 50.0000 mg | ORAL_TABLET | ORAL | Status: DC | PRN

## 2016-12-20 MED ORDER — SUGAMMADEX SODIUM 200 MG/2ML IV SOLN
INTRAVENOUS | Status: AC
  Filled 2016-12-20: qty 2

## 2016-12-20 MED ORDER — CYCLOBENZAPRINE HCL 10 MG PO TABS
ORAL_TABLET | ORAL | Status: AC
  Administered 2016-12-20: 5 mg via ORAL
  Filled 2016-12-20: qty 1

## 2016-12-20 MED ORDER — CYCLOBENZAPRINE HCL 5 MG PO TABS
5.0000 mg | ORAL_TABLET | Freq: Three times a day (TID) | ORAL | Status: DC | PRN
  Administered 2016-12-20: 10 mg via ORAL
  Administered 2016-12-20: 5 mg via ORAL
  Filled 2016-12-20: qty 2

## 2016-12-20 SURGICAL SUPPLY — 64 items
ALLOGRAFT 7X14X11 (Bone Implant) ×4 IMPLANT
BAG DECANTER FOR FLEXI CONT (MISCELLANEOUS) ×2 IMPLANT
BIT DRILL 14X2.5XNS TI ANT (BIT) ×1 IMPLANT
BIT DRILL AVIATOR 14 (BIT) ×1
BIT DRILL NEURO 2X3.1 SFT TUCH (MISCELLANEOUS) ×1 IMPLANT
BIT DRL 14X2.5XNS TI ANT (BIT) ×1
BLADE ULTRA TIP 2M (BLADE) IMPLANT
CANISTER SUCT 3000ML PPV (MISCELLANEOUS) ×2 IMPLANT
CARTRIDGE OIL MAESTRO DRILL (MISCELLANEOUS) ×1 IMPLANT
COVER MAYO STAND STRL (DRAPES) ×2 IMPLANT
DECANTER SPIKE VIAL GLASS SM (MISCELLANEOUS) ×4 IMPLANT
DERMABOND ADVANCED (GAUZE/BANDAGES/DRESSINGS) ×1
DERMABOND ADVANCED .7 DNX12 (GAUZE/BANDAGES/DRESSINGS) ×1 IMPLANT
DIFFUSER DRILL AIR PNEUMATIC (MISCELLANEOUS) ×2 IMPLANT
DRAPE HALF SHEET 40X57 (DRAPES) IMPLANT
DRAPE LAPAROTOMY 100X72 PEDS (DRAPES) ×2 IMPLANT
DRAPE MICROSCOPE LEICA (MISCELLANEOUS) ×2 IMPLANT
DRAPE POUCH INSTRU U-SHP 10X18 (DRAPES) ×2 IMPLANT
DRILL NEURO 2X3.1 SOFT TOUCH (MISCELLANEOUS) ×2
ELECT COATED BLADE 2.86 ST (ELECTRODE) ×2 IMPLANT
ELECT REM PT RETURN 9FT ADLT (ELECTROSURGICAL) ×2
ELECTRODE REM PT RTRN 9FT ADLT (ELECTROSURGICAL) ×1 IMPLANT
GAUZE SPONGE 4X4 16PLY XRAY LF (GAUZE/BANDAGES/DRESSINGS) ×2 IMPLANT
GLOVE BIO SURGEON STRL SZ8 (GLOVE) ×2 IMPLANT
GLOVE BIOGEL PI IND STRL 6.5 (GLOVE) ×2 IMPLANT
GLOVE BIOGEL PI IND STRL 8 (GLOVE) ×5 IMPLANT
GLOVE BIOGEL PI INDICATOR 6.5 (GLOVE) ×2
GLOVE BIOGEL PI INDICATOR 8 (GLOVE) ×5
GLOVE ECLIPSE 7.5 STRL STRAW (GLOVE) ×8 IMPLANT
GLOVE EXAM NITRILE LRG STRL (GLOVE) IMPLANT
GLOVE EXAM NITRILE XL STR (GLOVE) IMPLANT
GLOVE EXAM NITRILE XS STR PU (GLOVE) IMPLANT
GLOVE INDICATOR 8.5 STRL (GLOVE) ×2 IMPLANT
GLOVE SURG SS PI 6.0 STRL IVOR (GLOVE) ×8 IMPLANT
GOWN STRL REUS W/ TWL LRG LVL3 (GOWN DISPOSABLE) ×2 IMPLANT
GOWN STRL REUS W/ TWL XL LVL3 (GOWN DISPOSABLE) ×2 IMPLANT
GOWN STRL REUS W/TWL 2XL LVL3 (GOWN DISPOSABLE) ×4 IMPLANT
GOWN STRL REUS W/TWL LRG LVL3 (GOWN DISPOSABLE) ×4
GOWN STRL REUS W/TWL XL LVL3 (GOWN DISPOSABLE) ×4
HALTER HD/CHIN CERV TRACTION D (MISCELLANEOUS) ×2 IMPLANT
HEMOSTAT POWDER KIT SURGIFOAM (HEMOSTASIS) ×2 IMPLANT
KIT BASIN OR (CUSTOM PROCEDURE TRAY) ×2 IMPLANT
KIT ROOM TURNOVER OR (KITS) ×2 IMPLANT
NEEDLE HYPO 25X1 1.5 SAFETY (NEEDLE) ×2 IMPLANT
NEEDLE SPNL 22GX3.5 QUINCKE BK (NEEDLE) ×2 IMPLANT
NS IRRIG 1000ML POUR BTL (IV SOLUTION) ×2 IMPLANT
OIL CARTRIDGE MAESTRO DRILL (MISCELLANEOUS) ×2
PACK LAMINECTOMY NEURO (CUSTOM PROCEDURE TRAY) ×2 IMPLANT
PAD ARMBOARD 7.5X6 YLW CONV (MISCELLANEOUS) ×6 IMPLANT
PLATE AVIATOR ASSY 2LVL SZ 37 (Plate) ×2 IMPLANT
RASP 3.0MM (RASP) ×2 IMPLANT
RUBBERBAND STERILE (MISCELLANEOUS) ×4 IMPLANT
SCREW AVIAT VAR SLFTAP 4.35X14 (Screw) ×4 IMPLANT
SCREW AVIATOR VAR SELFTAP 4X14 (Screw) ×4 IMPLANT
SCREW AVIATOR VAR SELFTAP 4X16 (Screw) ×4 IMPLANT
SPONGE INTESTINAL PEANUT (DISPOSABLE) ×2 IMPLANT
SPONGE NEURO XRAY DETECT 1X3 (DISPOSABLE) ×2 IMPLANT
SPONGE SURGIFOAM ABS GEL SZ50 (HEMOSTASIS) ×2 IMPLANT
STAPLER SKIN PROX WIDE 3.9 (STAPLE) IMPLANT
SUT VIC AB 2-0 CP2 18 (SUTURE) ×2 IMPLANT
SUT VIC AB 3-0 SH 8-18 (SUTURE) ×4 IMPLANT
TOWEL GREEN STERILE (TOWEL DISPOSABLE) ×2 IMPLANT
TOWEL GREEN STERILE FF (TOWEL DISPOSABLE) IMPLANT
WATER STERILE IRR 1000ML POUR (IV SOLUTION) ×2 IMPLANT

## 2016-12-20 NOTE — Transfer of Care (Signed)
Immediate Anesthesia Transfer of Care Note  Patient: Paula Graves  Procedure(s) Performed: PARTIAL REMOVAL TETHER CERVICAL PLATE, ANTERIOR CERVICAL DECOMPRESSION/DISCECTOMY FUSION  CERVICAL TWO- CERVICAL THREE, CERVICAL THREE-CERVICAL FOUR  (N/A Neck)  Patient Location: PACU  Anesthesia Type:General  Level of Consciousness: awake, oriented and patient cooperative  Airway & Oxygen Therapy: Patient Spontanous Breathing and Patient connected to nasal cannula oxygen  Post-op Assessment: Report given to RN, Post -op Vital signs reviewed and stable and Patient moving all extremities  Post vital signs: Reviewed and stable  Last Vitals:  Vitals:   12/20/16 0642  BP: (!) 188/93  Pulse: 90  Resp: 19  Temp: 36.7 C  SpO2: 100%    Last Pain:  Vitals:   12/20/16 0642  TempSrc: Oral  PainSc:          Complications: No apparent anesthesia complications

## 2016-12-20 NOTE — H&P (Signed)
Subjective: Patient is a 66 y.o. left handed white female who is admitted for treatment of multilevel cervical spondylosis, cervical degenerative disease, and cervical disc herniations with resulting posterior neck pain and intrascapular pain. We have been treating this for over a year, but because of increasing renal insufficiency, she can no longer use NSAIDs. Patient is now brought to surgery for C2-3 and C3-4 anterior cervical decompression and arthrodesis with structural allograft and cervical plating. We plan on removing the superior aspect of her existing anterior cervical plate.  Past Medical History:  Diagnosis Date  . Anxiety   . Arthritis   . Chronic kidney disease    elevated creatinine  10/18   having renal studies- renal sonagram 12/15/16 dr patel  [facility] req  . Complication of anesthesia    had panic attack prior to first neck surgery  . Diabetes mellitus without complication ([place])    type II  d/c'd metformion 10/18 due to elevated creatinine-having renal studies  . Hypertension    dr d/c'd bp meds in 10/18 due to creatinine elevated- sonagram renal 12/15/16  . Pneumonia    hx    Past Surgical History:  Procedure Laterality Date  . ABDOMINAL HYSTERECTOMY    . bladder tack    . CARPAL TUNNEL RELEASE Left 2006  . CERVICAL DISC SURGERY     08,09  . CHOLECYSTECTOMY  80's  . TONSILLECTOMY     child  . TUBAL LIGATION      Medications Prior to Admission  Medication Sig Dispense Refill Last Dose  . Ascorbic Acid (VITAMIN C) 1000 MG tablet Take 1,000 mg daily by mouth.   12/19/2016 at Unknown time  . cyclobenzaprine (FLEXERIL) 10 MG tablet Take 5-10 mg 3 (three) times daily as needed by mouth for muscle spasms (5-10 depends on pain).   Past Week at Unknown time  . dicyclomine (BENTYL) 20 MG tablet Take 20 mg every 6 (six) hours as needed by mouth for spasms.   Past Month at Unknown time  . LORazepam (ATIVAN) 1 MG tablet Take 1 mg 2 (two) times daily as needed by mouth  for anxiety.   12/20/2016 at [place]  . Misc Natural Products (OSTEO BI-FLEX ADV TRIPLE ST) TABS Take 1 tablet daily by mouth.   Past Week at Unknown time  . simvastatin (ZOCOR) 40 MG tablet Take 40 mg daily at 6 PM by mouth.   12/19/2016 at Unknown time  . venlafaxine (EFFEXOR) 75 MG tablet Take 75 mg 3 (three) times daily with meals by mouth.   12/20/2016 at 0515  . SUMAtriptan (IMITREX) 100 MG tablet Take 100 mg every 2 (two) hours as needed by mouth for migraine. May repeat in 2 hours if headache persists or recurs.   More than a month at Unknown time   Allergies  Allergen Reactions  . Codeine Nausea And Vomiting and Other (See Comments)    Stomach pain/ cramps  . Morphine And Related Nausea And Vomiting    Social History   Tobacco Use  . Smoking status: Never Smoker  . Smokeless tobacco: Never Used  Substance Use Topics  . Alcohol use: No    Frequency: Never    History reviewed. No pertinent family history.   Review of Systems A comprehensive review of systems was negative.  Objective: Vital signs in last 24 hours: Temp:  [98 F (36.7 C)] 98 F (36.7 C) (11/14 0642) Pulse Rate:  [90] 90 (11/14 0642) Resp:  [19] 19 (11/14 0642) BP: (  188)/(93) 188/93 (11/14 0642) SpO2:  [100 %] 100 % (11/14 0642) Weight:  [66.2 kg (146 lb)] 66.2 kg (146 lb) (11/14 8185)  EXAM: Patient is a well-developed well-nourished white female in no acute chest. Lungs are clear to auscultation , the patient has symmetrical respiratory excursion. Heart has a regular rate and rhythm normal S1 and S2 no murmur.   Abdomen is soft nontender nondistended bowel sounds are present. Extremity examination shows no clubbing cyanosis or edema. Motor examination shows 5 over 5 strength in the upper extremities including the deltoid biceps triceps and intrinsics and grip. Sensation is intact to pinprick throughout the digits of the upper extremities. Reflexes are symmetrical and without evidence of pathologic reflexes.  Patient has a normal gait and stance.   Data Review:CBC    Component Value Date/Time   WBC 8.0 12/18/2016 1142   RBC 3.87 12/18/2016 1142   HGB 11.8 (L) 12/18/2016 1142   HCT 36.2 12/18/2016 1142   PLT 335 12/18/2016 1142   MCV 93.5 12/18/2016 1142   MCH 30.5 12/18/2016 1142   MCHC 32.6 12/18/2016 1142   RDW 12.8 12/18/2016 1142                          BMET    Component Value Date/Time   NA 139 12/18/2016 1142   K 4.1 12/18/2016 1142   CL 103 12/18/2016 1142   CO2 27 12/18/2016 1142   GLUCOSE 157 (H) 12/18/2016 1142   BUN 15 12/18/2016 1142   CREATININE 1.57 (H) 12/18/2016 1142   CALCIUM 9.8 12/18/2016 1142   GFRNONAA 33 (L) 12/18/2016 1142   GFRAA 38 (L) 12/18/2016 1142     Assessment/Plan: Patient with posterior neck and interscapular pain secondary to cervical spondylosis, degenerative disease, and disc herniations at C2-3 and C3-4. Patient admitted now for 2 level ACDF.  I've discussed with the patient the nature of his condition, the nature the surgical procedure, the typical length of surgery, hospital stay, and overall recuperation. We discussed limitations postoperatively. I discussed risks of surgery including risks of infection, bleeding, possibly need for transfusion, the risk of nerve root dysfunction with pain, weakness, numbness, or paresthesias, the risk of spinal cord dysfunction with paralysis of all 4 limbs and quadriplegia, and the risk of dural tear and CSF leakage and possible need for further surgery, the risk of esophageal dysfunction causing dysphagia and the risk of laryngeal dysfunction causing hoarseness of the voice, the risk of failure of the arthrodesis and the possible need for further surgery, and the risk of anesthetic complications including myocardial infarction, stroke, pneumonia, and death. We also discussed the need for postoperative immobilization in a cervical collar. Understanding all this the patient does wish to proceed with surgery and is  admitted for such.    Hosie Spangle, MD 12/20/2016 8:10 AM

## 2016-12-20 NOTE — Anesthesia Preprocedure Evaluation (Signed)
Anesthesia Evaluation  Patient identified by MRN, date of birth, ID band Patient awake    Reviewed: Allergy & Precautions, NPO status , Patient's Chart, lab work & pertinent test results  Airway Mallampati: II  TM Distance: >3 FB Neck ROM: Full    Dental no notable dental hx.    Pulmonary neg pulmonary ROS,    Pulmonary exam normal breath sounds clear to auscultation       Cardiovascular hypertension, Pt. on medications negative cardio ROS Normal cardiovascular exam Rhythm:Regular Rate:Normal     Neuro/Psych negative neurological ROS  negative psych ROS   GI/Hepatic negative GI ROS, Neg liver ROS,   Endo/Other  negative endocrine ROSdiabetes, Type 2, Oral Hypoglycemic Agents  Renal/GU Renal InsufficiencyRenal diseasenegative Renal ROS  negative genitourinary   Musculoskeletal negative musculoskeletal ROS (+) Arthritis , Osteoarthritis,    Abdominal   Peds negative pediatric ROS (+)  Hematology negative hematology ROS (+)   Anesthesia Other Findings   Reproductive/Obstetrics negative OB ROS                             Anesthesia Physical Anesthesia Plan  ASA: III  Anesthesia Plan: General   Post-op Pain Management:    Induction: Intravenous  PONV Risk Score and Plan: 3 and Treatment may vary due to age or medical condition, Ondansetron and Midazolam  Airway Management Planned: Oral ETT  Additional Equipment:   Intra-op Plan:   Post-operative Plan: Extubation in OR  Informed Consent: I have reviewed the patients History and Physical, chart, labs and discussed the procedure including the risks, benefits and alternatives for the proposed anesthesia with the patient or authorized representative who has indicated his/her understanding and acceptance.   Dental advisory given  Plan Discussed with: CRNA  Anesthesia Plan Comments:         Anesthesia Quick Evaluation

## 2016-12-20 NOTE — Op Note (Signed)
12/20/2016  11:28 AM  PATIENT:  Paula Graves  66 y.o. 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 Patient was placed in 10 pounds of halter traction. The neck was prepped with Betadine soap and solution and draped in a sterile fashion. A horizontal incision was made on the left side of the neck. The line of the incision was infiltrated with local anesthetic with epinephrine. Dissection was carried down thru the subcutaneous tissue and platysma, bipolar cautery was used to maintain hemostasis. Dissection was then carried out thru an avascular plane leaving the sternocleidomastoid carotid artery and jugular vein laterally and the trachea and esophagus medially. The ventral aspect of the vertebral column was identified, the existing anterior cervical plate was identified. We therefore by identified the C2-3 and C3-4 disc levels. The upper portion of the existing anterior cervical plate was cut with the metal cutting bur of the high-speed drill. Then the 2 upper screws were unscrewed, and the upper portion of the plate removed. The screw holes were filled with Surgifoam, to establish hemostasis. We then proceeded with the  decompression and arthrodesis at the C2-3 and C3-4 levels. The annulus at each level was incised and the disc space entered. Discectomy was performed with micro-curettes and pituitary rongeurs. The operating microscope was draped and brought into the field provided additional magnification illumination and visualization. Discectomy was continued posteriorly thru the disc space and then the cartilaginous endplate was removed using micro-curettes along with the high-speed drill. Posterior osteophytic overgrowth was removed each level using the high-speed drill along with a 2 mm thin footplated Kerrison punch. Posterior longitudinal ligament along with disc herniation was carefully removed, decompressing the spinal canal and thecal sac. We then continued to remove osteophytic overgrowth and disc material decompressing the neural foramina and exiting nerve roots bilaterally. Once the decompression was completed hemostasis was established at each level with the use of Gelfoam with thrombin and bipolar cautery. The Gelfoam was removed, and thin layer Surgifoam was applied, the wound irrigated and hemostasis confirmed. We then measured the height of the intravertebral disc space level and selected a 7 millimeter in height structural allograft for the C2-3 level and a 7 millimeter in height structural allograft for the C3-4 level . Each was hydrated and saline solution and then gently positioned in the intravertebral disc space and countersunk. We then selected a 37 millimeter in height Aviator cervical plate. It was positioned over the fusion construct and secured to the vertebra with a pair of 4 x 16 mm self-tapping variable screws at the C2 level, a pair of 4 x 14 mm self-tapping variable screws at the C3 level, and a pair of 4.35 x 14 mm self-tapping variable screws at the C4 level, using the existing screw holes. Each screw hole at C2 and C3 was started with the high-speed drill and then the screws placed, once all  the  screws were placed, the locking system was secured. The wound was irrigated with bacitracin solution checked for hemostasis which was established and confirmed. An x-ray was taken which showed grafts in good position, the plate and screws in good position, and the overall alignment to be good. We again irrigated the wound with bacitracin solution, hemostasis was confirmed, and we then proceeded with closure. The platysma was closed with interrupted inverted 2-0 undyed Vicryl suture, the subcutaneous and subcuticular closed with interrupted inverted 3-0 undyed Vicryl suture. The skin edges were approximated with Dermabond. Following surgery the patient was taken out of cervical traction. To be reversed and the anesthetic and taken to the recovery room for further care.  PLAN OF CARE: Admit for overnight observation  PATIENT DISPOSITION:  PACU - hemodynamically stable.   Delay start of Pharmacological VTE agent (>24hrs) due to surgical blood loss or risk of bleeding:  yes

## 2016-12-20 NOTE — Progress Notes (Signed)
Vitals:   12/20/16 1205 12/20/16 1220 12/20/16 1259 12/20/16 1622  BP: (!) 153/82  (!) 179/92 (!) 148/89  Pulse: 94  (!) 101 96  Resp: 11  16 16   Temp:  98.4 F (36.9 C) 98.3 F (36.8 C) 98.1 F (36.7 C)  TempSrc:      SpO2: 100%  96% 98%  Weight:      Height:        CBC Recent Labs    12/18/16 1142  WBC 8.0  HGB 11.8*  HCT 36.2  PLT 335   BMET Recent Labs    12/18/16 1142  NA 139  K 4.1  CL 103  CO2 27  GLUCOSE 157*  BUN 15  CREATININE 1.57*  CALCIUM 9.8    Patient resting in bed.  Has been up and ambulating in the halls.  Wound clean and dry.  No erythema, swelling, or drainage.  Voiding well.  Plan: Doing well following surgery.  We will plan on continued to progress through postoperative recovery.  Hosie Spangle, MD 12/20/2016, 5:41 PM

## 2016-12-20 NOTE — Anesthesia Procedure Notes (Signed)
Procedure Name: Intubation Date/Time: 12/20/2016 8:42 AM Performed by: Moshe Salisbury, CRNA Pre-anesthesia Checklist: Patient identified, Emergency Drugs available, Suction available and Patient being monitored Patient Re-evaluated:Patient Re-evaluated prior to induction Oxygen Delivery Method: Circle System Utilized Preoxygenation: Pre-oxygenation with 100% oxygen Induction Type: IV induction Ventilation: Mask ventilation without difficulty Laryngoscope Size: Glidescope and 4 Grade View: Grade I Tube type: Oral Tube size: 7.5 mm Number of attempts: 1 Airway Equipment and Method: Rigid stylet and Video-laryngoscopy Placement Confirmation: ETT inserted through vocal cords under direct vision,  positive ETCO2 and breath sounds checked- equal and bilateral Secured at: 21 cm Tube secured with: Tape Dental Injury: Teeth and Oropharynx as per pre-operative assessment

## 2016-12-20 NOTE — Anesthesia Postprocedure Evaluation (Signed)
Anesthesia Post Note  Patient: Paula Graves  Procedure(s) Performed: PARTIAL REMOVAL TETHER CERVICAL PLATE, ANTERIOR CERVICAL DECOMPRESSION/DISCECTOMY FUSION  CERVICAL TWO- CERVICAL THREE, CERVICAL THREE-CERVICAL FOUR  (N/A Neck)     Patient location during evaluation: PACU Anesthesia Type: General Level of consciousness: awake and alert Pain management: pain level controlled Vital Signs Assessment: post-procedure vital signs reviewed and stable Respiratory status: spontaneous breathing, nonlabored ventilation and respiratory function stable Cardiovascular status: blood pressure returned to baseline and stable Postop Assessment: no apparent nausea or vomiting Anesthetic complications: no    Last Vitals:  Vitals:   12/20/16 1220 12/20/16 1259  BP:  (!) 179/92  Pulse:  (!) 101  Resp:  16  Temp: 36.9 C 36.8 C  SpO2:  96%    Last Pain:  Vitals:   12/20/16 1220  TempSrc:   PainSc: [place]

## 2016-12-21 ENCOUNTER — Encounter (HOSPITAL_COMMUNITY): Payer: Self-pay | Admitting: Neurosurgery

## 2016-12-21 DIAGNOSIS — M5011 Cervical disc disorder with radiculopathy,  high cervical region: Secondary | ICD-10-CM | POA: Diagnosis not present

## 2016-12-21 LAB — GLUCOSE, CAPILLARY
Glucose-Capillary: 153 mg/dL — ABNORMAL HIGH (ref 65–99)
Glucose-Capillary: 157 mg/dL — ABNORMAL HIGH (ref 65–99)

## 2016-12-21 MED ORDER — HYDROCODONE-ACETAMINOPHEN 5-325 MG PO TABS: 1.0000 | ORAL_TABLET | ORAL | 0 refills | Status: DC | PRN

## 2016-12-21 NOTE — Discharge Summary (Signed)
Physician Discharge Summary  Patient ID: Paula Graves MRN: [id number] DOB/AGE: 67-Nov-1952 67 y.o.  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  Postoperatively she has done well.  She has been up and ambulating.  Her incision is healing nicely.  There is no swelling, erythema, or drainage.  She is being discharged home with instructions regarding wound care and activities.  She is scheduled to follow-up with me in the office in 3 weeks.  Discharge Exam: Blood pressure (!) 146/69, pulse 80, temperature 97.7 F (36.5 C), temperature source Oral, resp. rate 18, height 5\' 2"  (1.575 m), weight 66.2 kg (146 lb), SpO2 94 %.  Disposition:  Home   Allergies as of 12/21/2016      Reactions   Codeine Nausea And Vomiting, Other (See Comments)   Stomach pain/ cramps   Morphine And Related Nausea And Vomiting      Medication List    TAKE these medications   cyclobenzaprine 10 MG tablet Commonly known as:  FLEXERIL Take 5-10 mg 3 (three) times daily as needed by mouth for muscle spasms (5-10 depends on pain).   dicyclomine 20 MG tablet Commonly known as:  BENTYL Take 20 mg every 6 (six) hours as needed by mouth for spasms.   HYDROcodone-acetaminophen 5-325 MG tablet Commonly known as:  NORCO/VICODIN Take 1-2 tablets every 4 (four) hours as needed by mouth (pain).   LORazepam 1 MG tablet Commonly  known as:  ATIVAN Take 1 mg 2 (two) times daily as needed by mouth for anxiety.   OSTEO BI-FLEX ADV TRIPLE ST Tabs Take 1 tablet daily by mouth.   simvastatin 40 MG tablet Commonly known as:  ZOCOR Take 40 mg daily at 6 PM by mouth.   SUMAtriptan 100 MG tablet Commonly known as:  IMITREX Take 100 mg every 2 (two) hours as needed by mouth for migraine. May repeat in 2 hours if headache persists or recurs.   venlafaxine 75 MG tablet Commonly known as:  EFFEXOR Take 75 mg 3 (three) times daily with meals by mouth.   vitamin C 1000 MG tablet Take 1,000 mg daily by mouth.        SignedHosie Spangle 12/21/2016, 3:12 PM

## 2016-12-21 NOTE — Progress Notes (Signed)
Inpatient Diabetes Program Recommendations  AACE/ADA: New Consensus Statement on Inpatient Glycemic Control (2015)  Target Ranges:  Prepandial:   less than 140 mg/dL      Peak postprandial:   less than 180 mg/dL (1-2 hours)      Critically ill patients:  140 - 180 mg/dL   Lab Results  Component Value Date   GLUCAP 157 (H) 12/21/2016   HGBA1C 6.7 (H) 12/18/2016    Review of Glycemic Control  Diabetes history: DM2 Outpatient Diabetes medications: None - previously on metformin. Current orders for Inpatient glycemic control: Novolog 0-15 units tidwc and hs  HgbA1C - 6.7%. - Fairly good control.  Spoke with pt at length regarding importance of maintaining good glycemic control. Instructed pt to check blood sugars 2-3 times/day and record in logbook. Take to MD for review. Pt to see Nephrologist regarding elevated creatinine. If post-prandials elevated > 180 mg/dL, may need meal coverage insulin.  Inpatient Diabetes Program Recommendations:    F/U with PCP regarding her glycemic control Pt requested info on Libre glucose monitoring system. Will order Living Well With Diabetes book.  Discussed with RN.  Thank you. Lorenda Peck, RD, LDN, CDE Inpatient Diabetes Coordinator [phone]

## 2016-12-21 NOTE — Progress Notes (Signed)
Patient alert and oriented, mae's well, voiding adequate amount of urine, swallowing without difficulty, no c/o pain at time of discharge. Patient discharged home with family. Script and discharged instructions given to patient. Patient and family stated understanding of instructions given. Patient has an appointment with Dr. Nudelman 

## 2016-12-21 NOTE — Discharge Instructions (Signed)

## 2017-01-17 DIAGNOSIS — M542 Cervicalgia: Secondary | ICD-10-CM | POA: Diagnosis not present

## 2017-01-17 DIAGNOSIS — Z6825 Body mass index (BMI) 25.0-25.9, adult: Secondary | ICD-10-CM | POA: Diagnosis not present

## 2017-01-17 DIAGNOSIS — I10 Essential (primary) hypertension: Secondary | ICD-10-CM | POA: Diagnosis not present

## 2017-01-17 DIAGNOSIS — M5020 Other cervical disc displacement, unspecified cervical region: Secondary | ICD-10-CM | POA: Diagnosis not present

## 2017-01-17 DIAGNOSIS — Z9889 Other specified postprocedural states: Secondary | ICD-10-CM | POA: Diagnosis not present

## 2017-02-26 DIAGNOSIS — N183 Chronic kidney disease, stage 3 (moderate): Secondary | ICD-10-CM | POA: Diagnosis not present

## 2017-02-27 DIAGNOSIS — M542 Cervicalgia: Secondary | ICD-10-CM | POA: Diagnosis not present

## 2017-02-27 DIAGNOSIS — Z9889 Other specified postprocedural states: Secondary | ICD-10-CM | POA: Diagnosis not present

## 2017-02-27 DIAGNOSIS — M5030 Other cervical disc degeneration, unspecified cervical region: Secondary | ICD-10-CM | POA: Diagnosis not present

## 2017-02-27 DIAGNOSIS — M4722 Other spondylosis with radiculopathy, cervical region: Secondary | ICD-10-CM | POA: Diagnosis not present

## 2017-03-06 DIAGNOSIS — L304 Erythema intertrigo: Secondary | ICD-10-CM | POA: Diagnosis not present

## 2017-03-06 DIAGNOSIS — L820 Inflamed seborrheic keratosis: Secondary | ICD-10-CM | POA: Diagnosis not present

## 2017-03-06 DIAGNOSIS — Z1231 Encounter for screening mammogram for malignant neoplasm of breast: Secondary | ICD-10-CM | POA: Diagnosis not present

## 2017-03-07 DIAGNOSIS — I129 Hypertensive chronic kidney disease with stage 1 through stage 4 chronic kidney disease, or unspecified chronic kidney disease: Secondary | ICD-10-CM | POA: Diagnosis not present

## 2017-03-07 DIAGNOSIS — N183 Chronic kidney disease, stage 3 (moderate): Secondary | ICD-10-CM | POA: Diagnosis not present

## 2017-03-07 DIAGNOSIS — D631 Anemia in chronic kidney disease: Secondary | ICD-10-CM | POA: Diagnosis not present

## 2017-03-07 DIAGNOSIS — E1122 Type 2 diabetes mellitus with diabetic chronic kidney disease: Secondary | ICD-10-CM | POA: Diagnosis not present

## 2017-03-07 DIAGNOSIS — N2581 Secondary hyperparathyroidism of renal origin: Secondary | ICD-10-CM | POA: Diagnosis not present

## 2017-03-21 DIAGNOSIS — N183 Chronic kidney disease, stage 3 (moderate): Secondary | ICD-10-CM | POA: Diagnosis not present

## 2017-05-29 DIAGNOSIS — M5030 Other cervical disc degeneration, unspecified cervical region: Secondary | ICD-10-CM | POA: Diagnosis not present

## 2017-05-29 DIAGNOSIS — M4722 Other spondylosis with radiculopathy, cervical region: Secondary | ICD-10-CM | POA: Diagnosis not present

## 2017-05-29 DIAGNOSIS — Z9889 Other specified postprocedural states: Secondary | ICD-10-CM | POA: Diagnosis not present

## 2017-05-29 DIAGNOSIS — M542 Cervicalgia: Secondary | ICD-10-CM | POA: Diagnosis not present

## 2017-06-26 DIAGNOSIS — N183 Chronic kidney disease, stage 3 (moderate): Secondary | ICD-10-CM | POA: Diagnosis not present

## 2017-06-26 DIAGNOSIS — E1129 Type 2 diabetes mellitus with other diabetic kidney complication: Secondary | ICD-10-CM | POA: Diagnosis not present

## 2017-06-26 DIAGNOSIS — N2581 Secondary hyperparathyroidism of renal origin: Secondary | ICD-10-CM | POA: Diagnosis not present

## 2017-06-26 DIAGNOSIS — N189 Chronic kidney disease, unspecified: Secondary | ICD-10-CM | POA: Diagnosis not present

## 2017-07-04 DIAGNOSIS — N183 Chronic kidney disease, stage 3 (moderate): Secondary | ICD-10-CM | POA: Diagnosis not present

## 2017-07-04 DIAGNOSIS — D631 Anemia in chronic kidney disease: Secondary | ICD-10-CM | POA: Diagnosis not present

## 2017-07-04 DIAGNOSIS — I129 Hypertensive chronic kidney disease with stage 1 through stage 4 chronic kidney disease, or unspecified chronic kidney disease: Secondary | ICD-10-CM | POA: Diagnosis not present

## 2017-07-04 DIAGNOSIS — N2581 Secondary hyperparathyroidism of renal origin: Secondary | ICD-10-CM | POA: Diagnosis not present

## 2017-08-13 ENCOUNTER — Encounter: Payer: Self-pay | Admitting: Gastroenterology

## 2017-09-10 ENCOUNTER — Encounter: Payer: Self-pay | Admitting: Gastroenterology

## 2017-09-20 ENCOUNTER — Other Ambulatory Visit: Payer: Medicare Other

## 2017-09-20 ENCOUNTER — Ambulatory Visit (INDEPENDENT_AMBULATORY_CARE_PROVIDER_SITE_OTHER): Payer: Medicare Other | Admitting: Gastroenterology

## 2017-09-20 ENCOUNTER — Encounter: Payer: Self-pay | Admitting: Gastroenterology

## 2017-09-20 VITALS — BP 132/82 | HR 73 | Ht 62.0 in

## 2017-09-20 DIAGNOSIS — R1013 Epigastric pain: Secondary | ICD-10-CM

## 2017-09-20 DIAGNOSIS — K625 Hemorrhage of anus and rectum: Secondary | ICD-10-CM | POA: Diagnosis not present

## 2017-09-20 DIAGNOSIS — K5900 Constipation, unspecified: Secondary | ICD-10-CM

## 2017-09-20 DIAGNOSIS — R197 Diarrhea, unspecified: Secondary | ICD-10-CM

## 2017-09-20 NOTE — Progress Notes (Signed)
Chief Complaint: Abdominal pain, change in bowel habits  Referring Provider:  Dr Nicki Reaper      ASSESSMENT AND PLAN;   #1. Diarrhea with alt constipation (attributed to IBS), currently more constipated with occ rectal bleeding (colon neg 12/2012 Dr Melina Copa but poor preparation, 1 cm polyp on colonoscopy 04/2009, negative random colonic biopsies for microscopic colitis) #2. Epigastric Pain. (EGD 04/2009 showing moderate gastritis with negative small bowel biopsies) #3. Anemia associated with CKD3 (followed by Dr. Posey Pronto) Plan: - Proceed with EGD and colon (Miralax 2 day prep).  I have explained the risks and benefits.  She agrees to proceed. - CT scan of the abdomen and pelvis (PO contrast only, NO IV contrast due to CKD) if the above work-up is negative and continued problems. - Decrease bentyl 10mg  po bid prn - Miralax 17g !/2 PO qd. Can hold when she is having diarrhea. - Benefiber 1TSP po qd. - She would like to avoid PPIs due to CKD. - Discussed in detail with the patient and patient's husband.   HPI:    Paula Graves is a 67 y.o. female  Diagnosed previously with IBS Has been having epigastric abdominal pain associated with nausea, abdominal bloating, change in bowel habits in the form of alternating diarrhea and constipation.  She would occasionally have rectal bleeding.  Her last colonoscopy November 2014 per patient had poor preparation.  She has been advised to get repeat GI work-up.  Currently she is more constipated and did not have any bowel movement for the last 4 days.  Denies use of nonsteroidals.  On review of her labs from 06/2017 hemoglobin was 11.6 with MCV of 92, creatinine 1.43.   She had several abdominal surgeries including cholecystectomy, unilateral oophorectomy 2002, bladder tack, TAH 1994 due to endometriosis.   Past Medical History:  Diagnosis Date  . Anxiety   . Arthritis   . Chronic kidney disease    elevated creatinine  10/18   having renal studies-  renal sonagram 12/15/16 dr patel  [facility] req  . Complication of anesthesia    had panic attack prior to first neck surgery  . Diabetes mellitus without complication ([place])    type II  d/c'd metformion 10/18 due to elevated creatinine-having renal studies  . Hypertension    dr d/c'd bp meds in 10/18 due to creatinine elevated- sonagram renal 12/15/16  . Pneumonia    hx    Past Surgical History:  Procedure Laterality Date  . ABDOMINAL HYSTERECTOMY    . ANTERIOR CERVICAL DECOMP/DISCECTOMY FUSION N/A 12/20/2016   Procedure: PARTIAL REMOVAL TETHER CERVICAL PLATE, ANTERIOR CERVICAL DECOMPRESSION/DISCECTOMY FUSION  CERVICAL TWO- CERVICAL THREE, CERVICAL THREE-CERVICAL FOUR ;  Surgeon: Jovita Gamma, MD;  Location: [facility];  Service: Neurosurgery;  Laterality: N/A;  . bladder tack    . CARPAL TUNNEL RELEASE Left 2006  . CERVICAL DISC SURGERY     08,09  . CHOLECYSTECTOMY  80's  . COLONOSCOPY  04/22/2009   Colonic polyp, status post polypectomy.   . ESOPHAGOGASTRODUODENOSCOPY  04/22/2009   Moderate gastritis. Otherwise normal EGD.   . TONSILLECTOMY     child  . TUBAL LIGATION      Family History  Problem Relation Age of Onset  . Heart disease Mother   . Diabetes Maternal Grandmother     Social History   Tobacco Use  . Smoking status: Never Smoker  . Smokeless tobacco: Never Used  Substance Use Topics  . Alcohol use: No    Frequency:  Never  . Drug use: No    Current Outpatient Medications  Medication Sig Dispense Refill  . amlodipine-atorvastatin (CADUET) 10-10 MG tablet Take 1 tablet by mouth daily.    . Ascorbic Acid (VITAMIN C) 1000 MG tablet Take 1,000 mg daily by mouth.    . cyclobenzaprine (FLEXERIL) 10 MG tablet Take 5-10 mg 3 (three) times daily as needed by mouth for muscle spasms (5-10 depends on pain).    Marland Kitchen dicyclomine (BENTYL) 20 MG tablet Take 20 mg every 6 (six) hours as needed by mouth for spasms.    . furosemide (LASIX) 20 MG tablet Take 20 mg by  mouth.    Marland Kitchen HYDROcodone-acetaminophen (NORCO/VICODIN) 5-325 MG tablet Take 1-2 tablets every 4 (four) hours as needed by mouth (pain). 50 tablet 0  . LORazepam (ATIVAN) 1 MG tablet Take 1 mg 2 (two) times daily as needed by mouth for anxiety.    Marland Kitchen losartan (COZAAR) 25 MG tablet Take 25 mg by mouth daily.    . Misc Natural Products (OSTEO BI-FLEX ADV TRIPLE ST) TABS Take 1 tablet daily by mouth.    . simvastatin (ZOCOR) 40 MG tablet Take 40 mg daily at 6 PM by mouth.    . SUMAtriptan (IMITREX) 100 MG tablet Take 100 mg every 2 (two) hours as needed by mouth for migraine. May repeat in 2 hours if headache persists or recurs.    . venlafaxine (EFFEXOR) 75 MG tablet Take 75 mg 3 (three) times daily with meals by mouth.     No current facility-administered medications for this visit.     Allergies  Allergen Reactions  . Codeine Nausea And Vomiting and Other (See Comments)    Stomach pain/ cramps  . Morphine And Related Nausea And Vomiting    Review of Systems:  Constitutional: Denies fever, chills, diaphoresis, appetite change and fatigue.  HEENT: Denies photophobia, eye pain, redness, hearing loss, ear pain, congestion, sore throat, rhinorrhea, sneezing, mouth sores, neck pain, neck stiffness and tinnitus.   Respiratory: Denies SOB, DOE, cough, chest tightness,  and wheezing.   Cardiovascular: Denies chest pain, palpitations and leg swelling.  Genitourinary: Denies dysuria, urgency, frequency, hematuria, flank pain and difficulty urinating.  Musculoskeletal: Denies myalgias, joint swelling, arthralgias and gait problem. Has back pain.  Neck surgery 2007, 2008 and 2018 Skin: No rash.  Neurological: Denies dizziness, seizures, syncope, weakness, light-headedness, numbness and headaches.  Hematological: Denies adenopathy. Easy bruising, personal or family bleeding history  Psychiatric/Behavioral: Has anxiety or depression     Physical Exam:    BP 132/82   Pulse 73   Ht 5\' 2"  (1.575 m)    SpO2 98%   BMI 26.70 kg/m  Constitutional:  Well-developed, in no acute distress. Psychiatric: Normal mood and affect. Behavior is normal. HEENT: Pupils normal.  Conjunctivae are normal. No scleral icterus. Neck supple.  Cardiovascular: Normal rate, regular rhythm. No edema Pulmonary/chest: Effort normal and breath sounds normal. No wheezing, rales or rhonchi. Abdominal: Soft, nondistended.  Mild epigastric tenderness, well-healed surgical scars. Bowel sounds active throughout. There are no masses palpable. No hepatomegaly. Rectal:  defered Neurological: Alert and oriented to person place and time. Skin: Skin is warm and dry. No rashes noted.  Data Reviewed: I have personally reviewed following labs and imaging studies  CBC: CBC Latest Ref Rng & Units 12/18/2016 12/24/2007 12/14/2006  WBC 4.0 - 10.5 K/uL 8.0 10.6(H) 8.7  Hemoglobin 12.0 - 15.0 g/dL 11.8(L) 14.4 14.2  Hematocrit 36.0 - 46.0 % 36.2 42.3 41.8  Platelets 150 - 400 K/uL 335 270 297    CMP: CMP Latest Ref Rng & Units 12/18/2016  Glucose 65 - 99 mg/dL 157(H)  BUN 6 - 20 mg/dL 15  Creatinine 0.44 - 1.00 mg/dL 1.57(H)  Sodium 135 - 145 mmol/L 139  Potassium 3.5 - 5.1 mmol/L 4.1  Chloride 101 - 111 mmol/L 103  CO2 22 - 32 mmol/L 27  Calcium 8.9 - 10.3 mg/dL 9.8  Seen in presence of patient's husband.   Carmell Austria, MD 09/20/2017, 3:07 PM  Cc: Dr Nicki Reaper

## 2017-09-20 NOTE — Patient Instructions (Signed)
If you are age 68 or older, your body mass index should be between 23-30. Your Body mass index is 26.7 kg/m. If this is out of the aforementioned range listed, please consider follow up with your Primary Care Provider.  If you are age 49 or younger, your body mass index should be between 19-25. Your Body mass index is 26.7 kg/m. If this is out of the aformentioned range listed, please consider follow up with your Primary Care Provider.   Please purchase the following medications over the counter and take as directed: Miralax 17 grams once daily, do not take when you have diarrhea. Benefiber 1 teaspoon daily.   You have been scheduled for an endoscopy and colonoscopy. Please follow the written instructions given to you at your visit today. Please pick up your prep supplies at the pharmacy within the next 1-3 days. If you use inhalers (even only as needed), please bring them with you on the day of your procedure. Your physician has requested that you go to www.startemmi.com and enter the access code given to you at your visit today. This web site gives a general overview about your procedure. However, you should still follow specific instructions given to you by our office regarding your preparation for the procedure.  Thank you,  Dr. Jackquline Denmark

## 2017-11-07 ENCOUNTER — Encounter: Payer: Self-pay | Admitting: Gastroenterology

## 2017-11-07 ENCOUNTER — Ambulatory Visit (AMBULATORY_SURGERY_CENTER): Payer: Medicare Other | Admitting: Gastroenterology

## 2017-11-07 VITALS — BP 106/59 | HR 63 | Temp 99.1°F | Resp 10 | Ht 62.0 in | Wt 146.0 lb

## 2017-11-07 DIAGNOSIS — K219 Gastro-esophageal reflux disease without esophagitis: Secondary | ICD-10-CM | POA: Diagnosis not present

## 2017-11-07 DIAGNOSIS — K209 Esophagitis, unspecified: Secondary | ICD-10-CM

## 2017-11-07 DIAGNOSIS — K6289 Other specified diseases of anus and rectum: Secondary | ICD-10-CM | POA: Diagnosis not present

## 2017-11-07 DIAGNOSIS — R1013 Epigastric pain: Secondary | ICD-10-CM

## 2017-11-07 DIAGNOSIS — R197 Diarrhea, unspecified: Secondary | ICD-10-CM

## 2017-11-07 DIAGNOSIS — Z1211 Encounter for screening for malignant neoplasm of colon: Secondary | ICD-10-CM | POA: Diagnosis not present

## 2017-11-07 DIAGNOSIS — K2950 Unspecified chronic gastritis without bleeding: Secondary | ICD-10-CM | POA: Diagnosis not present

## 2017-11-07 MED ORDER — SODIUM CHLORIDE 0.9 % IV SOLN: 500.0000 mL | Freq: Once | INTRAVENOUS | Status: DC

## 2017-11-07 MED ORDER — PANTOPRAZOLE SODIUM 40 MG PO TBEC: 40.0000 mg | DELAYED_RELEASE_TABLET | Freq: Every day | ORAL | 1 refills | Status: DC

## 2017-11-07 NOTE — Op Note (Signed)
[facility] Patient Name: Paula Graves Procedure Date: 11/07/2017 1:29 PM MRN: [id number] Endoscopist: Jackquline Denmark , MD Age: 67 Referring MD:  Date of Birth: Jan 09, 1951 Gender: Female Account #: [id number] Procedure:                Upper GI endoscopy Indications:              Epigastric abdominal pain Medicines:                Monitored Anesthesia Care Procedure:                Pre-Anesthesia Assessment:                           - Prior to the procedure, a History and Physical                            was performed, and patient medications and                            allergies were reviewed. The patient's tolerance of                            previous anesthesia was also reviewed. The risks                            and benefits of the procedure and the sedation                            options and risks were discussed with the patient.                            All questions were answered, and informed consent                            was obtained. Prior Anticoagulants: The patient has                            taken no previous anticoagulant or antiplatelet                            agents. ASA Grade Assessment: II - A patient with                            mild systemic disease. After reviewing the risks                            and benefits, the patient was deemed in                            satisfactory condition to undergo the procedure.                           After obtaining informed consent, the endoscope was  passed under direct vision. Throughout the                            procedure, the patient's blood pressure, pulse, and                            oxygen saturations were monitored continuously. The                            Endoscope was introduced through the mouth, and                            advanced to the second part of duodenum. The upper                            GI endoscopy was accomplished  without difficulty.                            The patient tolerated the procedure well. Scope In: Scope Out: Findings:                 LA Grade B (one or more mucosal breaks greater than                            5 mm, not extending between the tops of two mucosal                            folds) esophagitis with no bleeding was found 36 cm                            from the incisors at GE junction. Biopsies were                            taken with a cold forceps for histology. Estimated                            blood loss was minimal.                           Localized mild inflammation characterized by                            erythema was found in the gastric antrum. Biopsies                            were taken with a cold forceps for histology.                           The duodenum was normal. Duodenal biopsies were not                            obtained since previous biopsies were negative for  celiac disease. Complications:            No immediate complications. Estimated Blood Loss:     Estimated blood loss: none. Impression:               - LA Grade B reflux esophagitis. Biopsied.                           - Gastritis. Biopsied. Recommendation:           - Resume previous diet.                           - Protonix 40 mg p.o. once a day.                           - Await pathology results.                           - Return to GI clinic PRN.                           - Avoid nonsteroidals Jackquline Denmark, MD 11/07/2017 2:15:17 PM This report has been signed electronically.

## 2017-11-07 NOTE — Op Note (Signed)
[place] Patient Name: Paula Graves Procedure Date: 11/07/2017 1:28 PM MRN: [id number] Endoscopist: Jackquline Denmark , MD Age: 67 Referring MD:  Date of Birth: November 05, 1950 Gender: Female Account #: [id number] Procedure:                Colonoscopy Indications:              Clinically significant diarrhea of unexplained                            origin Medicines:                Monitored Anesthesia Care Procedure:                Pre-Anesthesia Assessment:                           - Prior to the procedure, a History and Physical                            was performed, and patient medications and                            allergies were reviewed. The patient's tolerance of                            previous anesthesia was also reviewed. The risks                            and benefits of the procedure and the sedation                            options and risks were discussed with the patient.                            All questions were answered, and informed consent                            was obtained. Prior Anticoagulants: The patient has                            taken no previous anticoagulant or antiplatelet                            agents. ASA Grade Assessment: II - A patient with                            mild systemic disease. After reviewing the risks                            and benefits, the patient was deemed in                            satisfactory condition to undergo the procedure.  After obtaining informed consent, the colonoscope                            was passed under direct vision. Throughout the                            procedure, the patient's blood pressure, pulse, and                            oxygen saturations were monitored continuously. The                            Colonoscope was introduced through the anus and                            advanced to the 2 cm into the ileum. The        colonoscopy was performed without difficulty. The                            patient tolerated the procedure well. The quality                            of the bowel preparation was good. Scope In: 1:55:25 PM Scope Out: 2:08:47 PM Scope Withdrawal Time: 0 hours 8 minutes 37 seconds  Total Procedure Duration: 0 hours 13 minutes 22 seconds  Findings:                 A few small-mouthed diverticula were found in the                            sigmoid colon. Biopsies for histology were taken                            with a cold forceps from the entire colon for                            evaluation of microscopic colitis. Estimated blood                            loss: none.                           Non-bleeding internal hemorrhoids were found during                            retroflexion. The hemorrhoids were small.                           The exam was otherwise without abnormality on                            direct and retroflexion views. Complications:            No immediate complications. Estimated Blood Loss:     Estimated blood loss: none. Impression:               -  Minimal sigmoid diverticulosis.                           - Non-bleeding internal hemorrhoids.                           - Otherwise normal colonoscopy to TI. Recommendation:           - Patient has a contact number available for                            emergencies. The signs and symptoms of potential                            delayed complications were discussed with the                            patient. Return to normal activities tomorrow.                            Written discharge instructions were provided to the                            patient.                           - Resume previous diet.                           - Continue present medications.                           - Await pathology results.                           - Return to GI office PRN. Jackquline Denmark, MD 11/07/2017  2:19:20 PM This report has been signed electronically.

## 2017-11-07 NOTE — Progress Notes (Signed)
Called to room to assist during endoscopic procedure.  Patient ID and intended procedure confirmed with present staff. Received instructions for my participation in the procedure from the performing physician.  

## 2017-11-07 NOTE — Progress Notes (Signed)
To PACU, VSS. Report to [email or web address] 

## 2017-11-07 NOTE — Patient Instructions (Addendum)
Impression/Recommendations:  Gastritis handout given to patient. Diverticulosis handout given to patient. Hemorrhoid handout given to patient.  Resume previous diet. Continue present medications. Protonix 40 mg by mouth once daily. Use Miralax daily.  Await pathology results.  Return to GI clinic as needed.  Avoid nonsteroidals.    YOU HAD AN ENDOSCOPIC PROCEDURE TODAY AT [facility]:   Refer to the procedure report that was given to you for any specific questions about what was found during the examination.  If the procedure report does not answer your questions, please call your gastroenterologist to clarify.  If you requested that your care partner not be given the details of your procedure findings, then the procedure report has been included in a sealed envelope for you to review at your convenience later.  YOU SHOULD EXPECT: Some feelings of bloating in the abdomen. Passage of more gas than usual.  Walking can help get rid of the air that was put into your GI tract during the procedure and reduce the bloating. If you had a lower endoscopy (such as a colonoscopy or flexible sigmoidoscopy) you may notice spotting of blood in your stool or on the toilet paper. If you underwent a bowel prep for your procedure, you may not have a normal bowel movement for a few days.  Please Note:  You might notice some irritation and congestion in your nose or some drainage.  This is from the oxygen used during your procedure.  There is no need for concern and it should clear up in a day or so.  SYMPTOMS TO REPORT IMMEDIATELY:   Following lower endoscopy (colonoscopy or flexible sigmoidoscopy):  Excessive amounts of blood in the stool  Significant tenderness or worsening of abdominal pains  Swelling of the abdomen that is new, acute  Fever of 100F or higher   Following upper endoscopy (EGD)  Vomiting of blood or coffee ground material  New chest pain or pain under the shoulder  blades  Painful or persistently difficult swallowing  New shortness of breath  Fever of 100F or higher  Black, tarry-looking stools  For urgent or emergent issues, a gastroenterologist can be reached at any hour by calling [phone].   DIET:  We do recommend a small meal at first, but then you may proceed to your regular diet.  Drink plenty of fluids but you should avoid alcoholic beverages for 24 hours.  ACTIVITY:  You should plan to take it easy for the rest of today and you should NOT DRIVE or use heavy machinery until tomorrow (because of the sedation medicines used during the test).    FOLLOW UP: Our staff will call the number listed on your records the next business day following your procedure to check on you and address any questions or concerns that you may have regarding the information given to you following your procedure. If we do not reach you, we will leave a message.  However, if you are feeling well and you are not experiencing any problems, there is no need to return our call.  We will assume that you have returned to your regular daily activities without incident.  If any biopsies were taken you will be contacted by phone or by letter within the next 1-3 weeks.  Please call us at [phone] if you have not heard about the biopsies in 3 weeks.    SIGNATURES/CONFIDENTIALITY: You and/or your care partner have signed paperwork which will be entered into your electronic medical record.  These signatures attest to the fact that that the information above on your After Visit Summary has been reviewed and is understood.  Full responsibility of the confidentiality of this discharge information lies with you and/or your care-partner.

## 2017-11-08 ENCOUNTER — Telehealth: Payer: Self-pay

## 2017-11-08 NOTE — Telephone Encounter (Signed)
  Follow up Call-  Call back number 11/07/2017  Post procedure Call Back phone  # [phone]  Permission to leave phone message Yes  Some recent data might be hidden     Patient questions:  Do you have a fever, pain , or abdominal swelling? No. Pain Score  0 *  Have you tolerated food without any problems? Yes.    Have you been able to return to your normal activities? Yes.    Do you have any questions about your discharge instructions: Diet   No. Medications  No. Follow up visit  No.  Do you have questions or concerns about your Care? No.  Actions: * If pain score is 4 or above: No action needed, pain <4.

## 2017-11-13 DIAGNOSIS — Z79899 Other long term (current) drug therapy: Secondary | ICD-10-CM | POA: Diagnosis not present

## 2017-11-13 DIAGNOSIS — Z0000 Encounter for general adult medical examination without abnormal findings: Secondary | ICD-10-CM | POA: Diagnosis not present

## 2017-11-13 DIAGNOSIS — E782 Mixed hyperlipidemia: Secondary | ICD-10-CM | POA: Diagnosis not present

## 2017-11-13 DIAGNOSIS — Z1331 Encounter for screening for depression: Secondary | ICD-10-CM | POA: Diagnosis not present

## 2017-11-13 DIAGNOSIS — Z23 Encounter for immunization: Secondary | ICD-10-CM | POA: Diagnosis not present

## 2017-11-14 ENCOUNTER — Encounter: Payer: Self-pay | Admitting: Gastroenterology

## 2017-12-26 ENCOUNTER — Other Ambulatory Visit: Payer: Self-pay

## 2018-02-04 DIAGNOSIS — L820 Inflamed seborrheic keratosis: Secondary | ICD-10-CM | POA: Diagnosis not present

## 2018-02-04 DIAGNOSIS — L918 Other hypertrophic disorders of the skin: Secondary | ICD-10-CM | POA: Diagnosis not present

## 2018-03-04 DIAGNOSIS — N189 Chronic kidney disease, unspecified: Secondary | ICD-10-CM | POA: Diagnosis not present

## 2018-03-04 DIAGNOSIS — N2581 Secondary hyperparathyroidism of renal origin: Secondary | ICD-10-CM | POA: Diagnosis not present

## 2018-03-04 DIAGNOSIS — N183 Chronic kidney disease, stage 3 (moderate): Secondary | ICD-10-CM | POA: Diagnosis not present

## 2018-03-12 DIAGNOSIS — N2581 Secondary hyperparathyroidism of renal origin: Secondary | ICD-10-CM | POA: Diagnosis not present

## 2018-03-12 DIAGNOSIS — D631 Anemia in chronic kidney disease: Secondary | ICD-10-CM | POA: Diagnosis not present

## 2018-03-12 DIAGNOSIS — I129 Hypertensive chronic kidney disease with stage 1 through stage 4 chronic kidney disease, or unspecified chronic kidney disease: Secondary | ICD-10-CM | POA: Diagnosis not present

## 2018-03-12 DIAGNOSIS — N183 Chronic kidney disease, stage 3 (moderate): Secondary | ICD-10-CM | POA: Diagnosis not present

## 2018-03-21 DIAGNOSIS — Z1231 Encounter for screening mammogram for malignant neoplasm of breast: Secondary | ICD-10-CM | POA: Diagnosis not present

## 2018-04-08 DIAGNOSIS — R928 Other abnormal and inconclusive findings on diagnostic imaging of breast: Secondary | ICD-10-CM | POA: Diagnosis not present

## 2018-04-08 DIAGNOSIS — R921 Mammographic calcification found on diagnostic imaging of breast: Secondary | ICD-10-CM | POA: Diagnosis not present

## 2018-04-17 DIAGNOSIS — D0512 Intraductal carcinoma in situ of left breast: Secondary | ICD-10-CM | POA: Diagnosis not present

## 2018-04-17 DIAGNOSIS — R928 Other abnormal and inconclusive findings on diagnostic imaging of breast: Secondary | ICD-10-CM | POA: Diagnosis not present

## 2018-04-17 DIAGNOSIS — R921 Mammographic calcification found on diagnostic imaging of breast: Secondary | ICD-10-CM | POA: Diagnosis not present

## 2018-04-22 DIAGNOSIS — D0512 Intraductal carcinoma in situ of left breast: Secondary | ICD-10-CM | POA: Diagnosis not present

## 2018-05-01 ENCOUNTER — Other Ambulatory Visit: Payer: Self-pay | Admitting: Gastroenterology

## 2018-05-08 DIAGNOSIS — I2510 Atherosclerotic heart disease of native coronary artery without angina pectoris: Secondary | ICD-10-CM | POA: Diagnosis not present

## 2018-05-08 DIAGNOSIS — I10 Essential (primary) hypertension: Secondary | ICD-10-CM | POA: Diagnosis not present

## 2018-05-08 DIAGNOSIS — N6321 Unspecified lump in the left breast, upper outer quadrant: Secondary | ICD-10-CM | POA: Diagnosis not present

## 2018-05-08 DIAGNOSIS — Z79899 Other long term (current) drug therapy: Secondary | ICD-10-CM | POA: Diagnosis not present

## 2018-05-08 DIAGNOSIS — Z01818 Encounter for other preprocedural examination: Secondary | ICD-10-CM | POA: Diagnosis not present

## 2018-05-08 DIAGNOSIS — G4733 Obstructive sleep apnea (adult) (pediatric): Secondary | ICD-10-CM | POA: Diagnosis not present

## 2018-05-08 DIAGNOSIS — Z170 Estrogen receptor positive status [ER+]: Secondary | ICD-10-CM | POA: Diagnosis not present

## 2018-05-08 DIAGNOSIS — I129 Hypertensive chronic kidney disease with stage 1 through stage 4 chronic kidney disease, or unspecified chronic kidney disease: Secondary | ICD-10-CM | POA: Diagnosis not present

## 2018-05-08 DIAGNOSIS — E785 Hyperlipidemia, unspecified: Secondary | ICD-10-CM | POA: Diagnosis not present

## 2018-05-08 DIAGNOSIS — D0512 Intraductal carcinoma in situ of left breast: Secondary | ICD-10-CM | POA: Diagnosis not present

## 2018-05-08 DIAGNOSIS — N189 Chronic kidney disease, unspecified: Secondary | ICD-10-CM | POA: Diagnosis not present

## 2018-05-08 DIAGNOSIS — C50412 Malignant neoplasm of upper-outer quadrant of left female breast: Secondary | ICD-10-CM | POA: Insufficient documentation

## 2018-05-08 DIAGNOSIS — C50812 Malignant neoplasm of overlapping sites of left female breast: Secondary | ICD-10-CM | POA: Diagnosis not present

## 2018-05-08 DIAGNOSIS — F419 Anxiety disorder, unspecified: Secondary | ICD-10-CM | POA: Diagnosis not present

## 2018-05-08 DIAGNOSIS — K219 Gastro-esophageal reflux disease without esophagitis: Secondary | ICD-10-CM | POA: Diagnosis not present

## 2018-05-08 DIAGNOSIS — C50912 Malignant neoplasm of unspecified site of left female breast: Secondary | ICD-10-CM | POA: Diagnosis not present

## 2018-05-24 DIAGNOSIS — E7800 Pure hypercholesterolemia, unspecified: Secondary | ICD-10-CM | POA: Diagnosis not present

## 2018-05-24 DIAGNOSIS — I129 Hypertensive chronic kidney disease with stage 1 through stage 4 chronic kidney disease, or unspecified chronic kidney disease: Secondary | ICD-10-CM | POA: Diagnosis not present

## 2018-05-24 DIAGNOSIS — C50412 Malignant neoplasm of upper-outer quadrant of left female breast: Secondary | ICD-10-CM | POA: Diagnosis not present

## 2018-05-24 DIAGNOSIS — E785 Hyperlipidemia, unspecified: Secondary | ICD-10-CM | POA: Diagnosis not present

## 2018-05-24 DIAGNOSIS — I10 Essential (primary) hypertension: Secondary | ICD-10-CM | POA: Diagnosis not present

## 2018-05-24 DIAGNOSIS — Z79899 Other long term (current) drug therapy: Secondary | ICD-10-CM | POA: Diagnosis not present

## 2018-05-24 DIAGNOSIS — N189 Chronic kidney disease, unspecified: Secondary | ICD-10-CM | POA: Diagnosis not present

## 2018-05-24 DIAGNOSIS — Z170 Estrogen receptor positive status [ER+]: Secondary | ICD-10-CM | POA: Diagnosis not present

## 2018-05-24 DIAGNOSIS — K219 Gastro-esophageal reflux disease without esophagitis: Secondary | ICD-10-CM | POA: Diagnosis not present

## 2018-05-24 DIAGNOSIS — Z87898 Personal history of other specified conditions: Secondary | ICD-10-CM | POA: Diagnosis not present

## 2018-05-24 DIAGNOSIS — I4510 Unspecified right bundle-branch block: Secondary | ICD-10-CM | POA: Diagnosis not present

## 2018-05-24 DIAGNOSIS — R590 Localized enlarged lymph nodes: Secondary | ICD-10-CM | POA: Diagnosis not present

## 2018-05-27 DIAGNOSIS — N6321 Unspecified lump in the left breast, upper outer quadrant: Secondary | ICD-10-CM | POA: Diagnosis not present

## 2018-06-07 DIAGNOSIS — C50412 Malignant neoplasm of upper-outer quadrant of left female breast: Secondary | ICD-10-CM | POA: Diagnosis not present

## 2018-06-07 DIAGNOSIS — C50912 Malignant neoplasm of unspecified site of left female breast: Secondary | ICD-10-CM | POA: Diagnosis not present

## 2018-06-10 DIAGNOSIS — Z510 Encounter for antineoplastic radiation therapy: Secondary | ICD-10-CM | POA: Diagnosis not present

## 2018-06-10 DIAGNOSIS — C50412 Malignant neoplasm of upper-outer quadrant of left female breast: Secondary | ICD-10-CM | POA: Diagnosis not present

## 2018-06-12 DIAGNOSIS — Z510 Encounter for antineoplastic radiation therapy: Secondary | ICD-10-CM | POA: Diagnosis not present

## 2018-06-12 DIAGNOSIS — C50412 Malignant neoplasm of upper-outer quadrant of left female breast: Secondary | ICD-10-CM | POA: Diagnosis not present

## 2018-06-13 DIAGNOSIS — C50412 Malignant neoplasm of upper-outer quadrant of left female breast: Secondary | ICD-10-CM | POA: Diagnosis not present

## 2018-06-13 DIAGNOSIS — Z510 Encounter for antineoplastic radiation therapy: Secondary | ICD-10-CM | POA: Diagnosis not present

## 2018-06-13 DIAGNOSIS — D0512 Intraductal carcinoma in situ of left breast: Secondary | ICD-10-CM | POA: Diagnosis not present

## 2018-06-14 DIAGNOSIS — C50412 Malignant neoplasm of upper-outer quadrant of left female breast: Secondary | ICD-10-CM | POA: Diagnosis not present

## 2018-06-18 DIAGNOSIS — C50412 Malignant neoplasm of upper-outer quadrant of left female breast: Secondary | ICD-10-CM | POA: Diagnosis not present

## 2018-06-18 DIAGNOSIS — Z780 Asymptomatic menopausal state: Secondary | ICD-10-CM | POA: Diagnosis not present

## 2018-06-18 DIAGNOSIS — M8580 Other specified disorders of bone density and structure, unspecified site: Secondary | ICD-10-CM | POA: Diagnosis not present

## 2018-06-18 DIAGNOSIS — M8589 Other specified disorders of bone density and structure, multiple sites: Secondary | ICD-10-CM | POA: Diagnosis not present

## 2018-06-18 DIAGNOSIS — D0512 Intraductal carcinoma in situ of left breast: Secondary | ICD-10-CM | POA: Diagnosis not present

## 2018-06-18 DIAGNOSIS — Z510 Encounter for antineoplastic radiation therapy: Secondary | ICD-10-CM | POA: Diagnosis not present

## 2018-06-19 DIAGNOSIS — Z510 Encounter for antineoplastic radiation therapy: Secondary | ICD-10-CM | POA: Diagnosis not present

## 2018-06-19 DIAGNOSIS — C50412 Malignant neoplasm of upper-outer quadrant of left female breast: Secondary | ICD-10-CM | POA: Diagnosis not present

## 2018-06-19 DIAGNOSIS — D0512 Intraductal carcinoma in situ of left breast: Secondary | ICD-10-CM | POA: Diagnosis not present

## 2018-06-20 DIAGNOSIS — C50412 Malignant neoplasm of upper-outer quadrant of left female breast: Secondary | ICD-10-CM | POA: Diagnosis not present

## 2018-06-20 DIAGNOSIS — D0512 Intraductal carcinoma in situ of left breast: Secondary | ICD-10-CM | POA: Diagnosis not present

## 2018-06-20 DIAGNOSIS — Z510 Encounter for antineoplastic radiation therapy: Secondary | ICD-10-CM | POA: Diagnosis not present

## 2018-06-21 DIAGNOSIS — Z510 Encounter for antineoplastic radiation therapy: Secondary | ICD-10-CM | POA: Diagnosis not present

## 2018-06-21 DIAGNOSIS — D0512 Intraductal carcinoma in situ of left breast: Secondary | ICD-10-CM | POA: Diagnosis not present

## 2018-06-21 DIAGNOSIS — C50412 Malignant neoplasm of upper-outer quadrant of left female breast: Secondary | ICD-10-CM | POA: Diagnosis not present

## 2018-06-24 DIAGNOSIS — C50412 Malignant neoplasm of upper-outer quadrant of left female breast: Secondary | ICD-10-CM | POA: Diagnosis not present

## 2018-06-25 DIAGNOSIS — C50412 Malignant neoplasm of upper-outer quadrant of left female breast: Secondary | ICD-10-CM | POA: Diagnosis not present

## 2018-06-25 DIAGNOSIS — Z510 Encounter for antineoplastic radiation therapy: Secondary | ICD-10-CM | POA: Diagnosis not present

## 2018-06-26 DIAGNOSIS — C50412 Malignant neoplasm of upper-outer quadrant of left female breast: Secondary | ICD-10-CM | POA: Diagnosis not present

## 2018-06-26 DIAGNOSIS — Z510 Encounter for antineoplastic radiation therapy: Secondary | ICD-10-CM | POA: Diagnosis not present

## 2018-06-27 DIAGNOSIS — C50412 Malignant neoplasm of upper-outer quadrant of left female breast: Secondary | ICD-10-CM | POA: Diagnosis not present

## 2018-06-27 DIAGNOSIS — N6489 Other specified disorders of breast: Secondary | ICD-10-CM | POA: Diagnosis not present

## 2018-06-27 DIAGNOSIS — Z510 Encounter for antineoplastic radiation therapy: Secondary | ICD-10-CM | POA: Diagnosis not present

## 2018-06-27 DIAGNOSIS — D0512 Intraductal carcinoma in situ of left breast: Secondary | ICD-10-CM | POA: Diagnosis not present

## 2018-07-02 DIAGNOSIS — Z510 Encounter for antineoplastic radiation therapy: Secondary | ICD-10-CM | POA: Diagnosis not present

## 2018-07-02 DIAGNOSIS — C50412 Malignant neoplasm of upper-outer quadrant of left female breast: Secondary | ICD-10-CM | POA: Diagnosis not present

## 2018-07-03 DIAGNOSIS — C50412 Malignant neoplasm of upper-outer quadrant of left female breast: Secondary | ICD-10-CM | POA: Diagnosis not present

## 2018-07-03 DIAGNOSIS — Z510 Encounter for antineoplastic radiation therapy: Secondary | ICD-10-CM | POA: Diagnosis not present

## 2018-07-04 DIAGNOSIS — C50412 Malignant neoplasm of upper-outer quadrant of left female breast: Secondary | ICD-10-CM | POA: Diagnosis not present

## 2018-07-04 DIAGNOSIS — Z510 Encounter for antineoplastic radiation therapy: Secondary | ICD-10-CM | POA: Diagnosis not present

## 2018-07-05 DIAGNOSIS — C50412 Malignant neoplasm of upper-outer quadrant of left female breast: Secondary | ICD-10-CM | POA: Diagnosis not present

## 2018-07-05 DIAGNOSIS — Z510 Encounter for antineoplastic radiation therapy: Secondary | ICD-10-CM | POA: Diagnosis not present

## 2018-07-08 DIAGNOSIS — D0512 Intraductal carcinoma in situ of left breast: Secondary | ICD-10-CM | POA: Diagnosis not present

## 2018-07-08 DIAGNOSIS — Z510 Encounter for antineoplastic radiation therapy: Secondary | ICD-10-CM | POA: Diagnosis not present

## 2018-07-08 DIAGNOSIS — C50412 Malignant neoplasm of upper-outer quadrant of left female breast: Secondary | ICD-10-CM | POA: Diagnosis not present

## 2018-07-09 DIAGNOSIS — D0512 Intraductal carcinoma in situ of left breast: Secondary | ICD-10-CM | POA: Diagnosis not present

## 2018-07-09 DIAGNOSIS — C50412 Malignant neoplasm of upper-outer quadrant of left female breast: Secondary | ICD-10-CM | POA: Diagnosis not present

## 2018-07-09 DIAGNOSIS — Z510 Encounter for antineoplastic radiation therapy: Secondary | ICD-10-CM | POA: Diagnosis not present

## 2018-07-10 DIAGNOSIS — D0512 Intraductal carcinoma in situ of left breast: Secondary | ICD-10-CM | POA: Diagnosis not present

## 2018-07-10 DIAGNOSIS — C50412 Malignant neoplasm of upper-outer quadrant of left female breast: Secondary | ICD-10-CM | POA: Diagnosis not present

## 2018-07-10 DIAGNOSIS — Z510 Encounter for antineoplastic radiation therapy: Secondary | ICD-10-CM | POA: Diagnosis not present

## 2018-07-11 DIAGNOSIS — L7634 Postprocedural seroma of skin and subcutaneous tissue following other procedure: Secondary | ICD-10-CM | POA: Diagnosis not present

## 2018-07-11 DIAGNOSIS — Z510 Encounter for antineoplastic radiation therapy: Secondary | ICD-10-CM | POA: Diagnosis not present

## 2018-07-11 DIAGNOSIS — D0512 Intraductal carcinoma in situ of left breast: Secondary | ICD-10-CM | POA: Diagnosis not present

## 2018-07-11 DIAGNOSIS — C50412 Malignant neoplasm of upper-outer quadrant of left female breast: Secondary | ICD-10-CM | POA: Diagnosis not present

## 2018-07-11 DIAGNOSIS — N6489 Other specified disorders of breast: Secondary | ICD-10-CM | POA: Diagnosis not present

## 2018-07-12 DIAGNOSIS — C50412 Malignant neoplasm of upper-outer quadrant of left female breast: Secondary | ICD-10-CM | POA: Diagnosis not present

## 2018-07-12 DIAGNOSIS — D0512 Intraductal carcinoma in situ of left breast: Secondary | ICD-10-CM | POA: Diagnosis not present

## 2018-07-12 DIAGNOSIS — Z510 Encounter for antineoplastic radiation therapy: Secondary | ICD-10-CM | POA: Diagnosis not present

## 2018-07-15 DIAGNOSIS — C50412 Malignant neoplasm of upper-outer quadrant of left female breast: Secondary | ICD-10-CM | POA: Diagnosis not present

## 2018-07-15 DIAGNOSIS — Z510 Encounter for antineoplastic radiation therapy: Secondary | ICD-10-CM | POA: Diagnosis not present

## 2018-07-15 DIAGNOSIS — D0512 Intraductal carcinoma in situ of left breast: Secondary | ICD-10-CM | POA: Diagnosis not present

## 2018-07-16 DIAGNOSIS — D0512 Intraductal carcinoma in situ of left breast: Secondary | ICD-10-CM | POA: Diagnosis not present

## 2018-07-16 DIAGNOSIS — C50412 Malignant neoplasm of upper-outer quadrant of left female breast: Secondary | ICD-10-CM | POA: Diagnosis not present

## 2018-07-16 DIAGNOSIS — Z510 Encounter for antineoplastic radiation therapy: Secondary | ICD-10-CM | POA: Diagnosis not present

## 2018-07-17 DIAGNOSIS — Z510 Encounter for antineoplastic radiation therapy: Secondary | ICD-10-CM | POA: Diagnosis not present

## 2018-07-17 DIAGNOSIS — C50412 Malignant neoplasm of upper-outer quadrant of left female breast: Secondary | ICD-10-CM | POA: Diagnosis not present

## 2018-07-17 DIAGNOSIS — D0512 Intraductal carcinoma in situ of left breast: Secondary | ICD-10-CM | POA: Diagnosis not present

## 2018-07-18 DIAGNOSIS — D0512 Intraductal carcinoma in situ of left breast: Secondary | ICD-10-CM | POA: Diagnosis not present

## 2018-07-18 DIAGNOSIS — C50412 Malignant neoplasm of upper-outer quadrant of left female breast: Secondary | ICD-10-CM | POA: Diagnosis not present

## 2018-07-18 DIAGNOSIS — Z510 Encounter for antineoplastic radiation therapy: Secondary | ICD-10-CM | POA: Diagnosis not present

## 2018-07-19 DIAGNOSIS — C50412 Malignant neoplasm of upper-outer quadrant of left female breast: Secondary | ICD-10-CM | POA: Diagnosis not present

## 2018-07-19 DIAGNOSIS — D0512 Intraductal carcinoma in situ of left breast: Secondary | ICD-10-CM | POA: Diagnosis not present

## 2018-07-19 DIAGNOSIS — Z510 Encounter for antineoplastic radiation therapy: Secondary | ICD-10-CM | POA: Diagnosis not present

## 2018-07-22 DIAGNOSIS — D0512 Intraductal carcinoma in situ of left breast: Secondary | ICD-10-CM | POA: Diagnosis not present

## 2018-07-22 DIAGNOSIS — Z510 Encounter for antineoplastic radiation therapy: Secondary | ICD-10-CM | POA: Diagnosis not present

## 2018-07-22 DIAGNOSIS — C50412 Malignant neoplasm of upper-outer quadrant of left female breast: Secondary | ICD-10-CM | POA: Diagnosis not present

## 2018-07-23 DIAGNOSIS — C50412 Malignant neoplasm of upper-outer quadrant of left female breast: Secondary | ICD-10-CM | POA: Diagnosis not present

## 2018-07-23 DIAGNOSIS — D0512 Intraductal carcinoma in situ of left breast: Secondary | ICD-10-CM | POA: Diagnosis not present

## 2018-07-23 DIAGNOSIS — Z510 Encounter for antineoplastic radiation therapy: Secondary | ICD-10-CM | POA: Diagnosis not present

## 2018-07-24 DIAGNOSIS — D0512 Intraductal carcinoma in situ of left breast: Secondary | ICD-10-CM | POA: Diagnosis not present

## 2018-07-24 DIAGNOSIS — Z510 Encounter for antineoplastic radiation therapy: Secondary | ICD-10-CM | POA: Diagnosis not present

## 2018-07-24 DIAGNOSIS — C50412 Malignant neoplasm of upper-outer quadrant of left female breast: Secondary | ICD-10-CM | POA: Diagnosis not present

## 2018-07-25 DIAGNOSIS — Z510 Encounter for antineoplastic radiation therapy: Secondary | ICD-10-CM | POA: Diagnosis not present

## 2018-07-25 DIAGNOSIS — D0512 Intraductal carcinoma in situ of left breast: Secondary | ICD-10-CM | POA: Diagnosis not present

## 2018-07-25 DIAGNOSIS — C50412 Malignant neoplasm of upper-outer quadrant of left female breast: Secondary | ICD-10-CM | POA: Diagnosis not present

## 2018-07-26 DIAGNOSIS — C50412 Malignant neoplasm of upper-outer quadrant of left female breast: Secondary | ICD-10-CM | POA: Diagnosis not present

## 2018-07-26 DIAGNOSIS — Z510 Encounter for antineoplastic radiation therapy: Secondary | ICD-10-CM | POA: Diagnosis not present

## 2018-07-26 DIAGNOSIS — D0512 Intraductal carcinoma in situ of left breast: Secondary | ICD-10-CM | POA: Diagnosis not present

## 2018-07-29 DIAGNOSIS — C50412 Malignant neoplasm of upper-outer quadrant of left female breast: Secondary | ICD-10-CM | POA: Diagnosis not present

## 2018-07-29 DIAGNOSIS — D0512 Intraductal carcinoma in situ of left breast: Secondary | ICD-10-CM | POA: Diagnosis not present

## 2018-07-29 DIAGNOSIS — Z510 Encounter for antineoplastic radiation therapy: Secondary | ICD-10-CM | POA: Diagnosis not present

## 2018-07-30 DIAGNOSIS — C50412 Malignant neoplasm of upper-outer quadrant of left female breast: Secondary | ICD-10-CM | POA: Diagnosis not present

## 2018-07-30 DIAGNOSIS — Z510 Encounter for antineoplastic radiation therapy: Secondary | ICD-10-CM | POA: Diagnosis not present

## 2018-07-30 DIAGNOSIS — D0512 Intraductal carcinoma in situ of left breast: Secondary | ICD-10-CM | POA: Diagnosis not present

## 2018-07-31 DIAGNOSIS — Z510 Encounter for antineoplastic radiation therapy: Secondary | ICD-10-CM | POA: Diagnosis not present

## 2018-07-31 DIAGNOSIS — D0512 Intraductal carcinoma in situ of left breast: Secondary | ICD-10-CM | POA: Diagnosis not present

## 2018-07-31 DIAGNOSIS — C50412 Malignant neoplasm of upper-outer quadrant of left female breast: Secondary | ICD-10-CM | POA: Diagnosis not present

## 2018-08-01 DIAGNOSIS — D0512 Intraductal carcinoma in situ of left breast: Secondary | ICD-10-CM | POA: Diagnosis not present

## 2018-08-01 DIAGNOSIS — Z510 Encounter for antineoplastic radiation therapy: Secondary | ICD-10-CM | POA: Diagnosis not present

## 2018-08-01 DIAGNOSIS — C50412 Malignant neoplasm of upper-outer quadrant of left female breast: Secondary | ICD-10-CM | POA: Diagnosis not present

## 2018-08-28 DIAGNOSIS — C50412 Malignant neoplasm of upper-outer quadrant of left female breast: Secondary | ICD-10-CM | POA: Diagnosis not present

## 2018-09-06 DIAGNOSIS — Z170 Estrogen receptor positive status [ER+]: Secondary | ICD-10-CM | POA: Diagnosis not present

## 2018-09-06 DIAGNOSIS — D0512 Intraductal carcinoma in situ of left breast: Secondary | ICD-10-CM | POA: Diagnosis not present

## 2018-09-06 DIAGNOSIS — C50412 Malignant neoplasm of upper-outer quadrant of left female breast: Secondary | ICD-10-CM | POA: Diagnosis not present

## 2018-09-24 DIAGNOSIS — N6489 Other specified disorders of breast: Secondary | ICD-10-CM | POA: Diagnosis not present

## 2018-09-24 DIAGNOSIS — Z170 Estrogen receptor positive status [ER+]: Secondary | ICD-10-CM | POA: Diagnosis not present

## 2018-09-24 DIAGNOSIS — L821 Other seborrheic keratosis: Secondary | ICD-10-CM | POA: Diagnosis not present

## 2018-09-24 DIAGNOSIS — L820 Inflamed seborrheic keratosis: Secondary | ICD-10-CM | POA: Diagnosis not present

## 2018-09-24 DIAGNOSIS — D225 Melanocytic nevi of trunk: Secondary | ICD-10-CM | POA: Diagnosis not present

## 2018-09-24 DIAGNOSIS — C50412 Malignant neoplasm of upper-outer quadrant of left female breast: Secondary | ICD-10-CM | POA: Diagnosis not present

## 2018-09-24 DIAGNOSIS — C44729 Squamous cell carcinoma of skin of left lower limb, including hip: Secondary | ICD-10-CM | POA: Diagnosis not present

## 2018-10-22 DIAGNOSIS — C44729 Squamous cell carcinoma of skin of left lower limb, including hip: Secondary | ICD-10-CM | POA: Diagnosis not present

## 2018-10-30 DIAGNOSIS — D0512 Intraductal carcinoma in situ of left breast: Secondary | ICD-10-CM | POA: Diagnosis not present

## 2018-10-30 DIAGNOSIS — C50412 Malignant neoplasm of upper-outer quadrant of left female breast: Secondary | ICD-10-CM | POA: Diagnosis not present

## 2018-10-30 DIAGNOSIS — Z79810 Long term (current) use of selective estrogen receptor modulators (SERMs): Secondary | ICD-10-CM | POA: Diagnosis not present

## 2018-10-30 DIAGNOSIS — Z170 Estrogen receptor positive status [ER+]: Secondary | ICD-10-CM | POA: Diagnosis not present

## 2018-11-06 DIAGNOSIS — I10 Essential (primary) hypertension: Secondary | ICD-10-CM | POA: Diagnosis not present

## 2018-11-06 DIAGNOSIS — E1165 Type 2 diabetes mellitus with hyperglycemia: Secondary | ICD-10-CM | POA: Diagnosis not present

## 2018-11-06 DIAGNOSIS — E785 Hyperlipidemia, unspecified: Secondary | ICD-10-CM | POA: Diagnosis not present

## 2018-11-20 DIAGNOSIS — D0512 Intraductal carcinoma in situ of left breast: Secondary | ICD-10-CM | POA: Diagnosis not present

## 2018-11-21 DIAGNOSIS — L820 Inflamed seborrheic keratosis: Secondary | ICD-10-CM | POA: Diagnosis not present

## 2018-11-21 DIAGNOSIS — D485 Neoplasm of uncertain behavior of skin: Secondary | ICD-10-CM | POA: Diagnosis not present

## 2018-11-21 DIAGNOSIS — L821 Other seborrheic keratosis: Secondary | ICD-10-CM | POA: Diagnosis not present

## 2018-11-21 DIAGNOSIS — D2239 Melanocytic nevi of other parts of face: Secondary | ICD-10-CM | POA: Diagnosis not present

## 2018-11-21 DIAGNOSIS — C8338 Diffuse large B-cell lymphoma, lymph nodes of multiple sites: Secondary | ICD-10-CM | POA: Diagnosis not present

## 2018-11-21 DIAGNOSIS — D225 Melanocytic nevi of trunk: Secondary | ICD-10-CM | POA: Diagnosis not present

## 2018-11-21 DIAGNOSIS — L814 Other melanin hyperpigmentation: Secondary | ICD-10-CM | POA: Diagnosis not present

## 2018-12-02 ENCOUNTER — Telehealth: Payer: Self-pay | Admitting: Gastroenterology

## 2018-12-02 NOTE — Telephone Encounter (Signed)
Pt needs rf for pantoprazole sent to [facility] in [place], she is requesting a 3 month supply.

## 2018-12-02 NOTE — Telephone Encounter (Signed)
Would you like to refill this medication?  

## 2018-12-03 NOTE — Telephone Encounter (Signed)
Please do Protonix 40 mg p.o. once a day, 90, 4 refills  RG

## 2018-12-04 MED ORDER — PANTOPRAZOLE SODIUM 40 MG PO TBEC: DELAYED_RELEASE_TABLET | ORAL | 4 refills | Status: DC

## 2018-12-04 NOTE — Telephone Encounter (Signed)
I have sent refill to patients pharmacy.  

## 2018-12-11 DIAGNOSIS — E785 Hyperlipidemia, unspecified: Secondary | ICD-10-CM | POA: Diagnosis not present

## 2018-12-11 DIAGNOSIS — E119 Type 2 diabetes mellitus without complications: Secondary | ICD-10-CM | POA: Diagnosis not present

## 2018-12-11 DIAGNOSIS — E782 Mixed hyperlipidemia: Secondary | ICD-10-CM | POA: Diagnosis not present

## 2018-12-11 DIAGNOSIS — Z0000 Encounter for general adult medical examination without abnormal findings: Secondary | ICD-10-CM | POA: Diagnosis not present

## 2018-12-11 DIAGNOSIS — Z23 Encounter for immunization: Secondary | ICD-10-CM | POA: Diagnosis not present

## 2018-12-11 DIAGNOSIS — Z136 Encounter for screening for cardiovascular disorders: Secondary | ICD-10-CM | POA: Diagnosis not present

## 2018-12-11 DIAGNOSIS — I10 Essential (primary) hypertension: Secondary | ICD-10-CM | POA: Diagnosis not present

## 2018-12-11 DIAGNOSIS — Z79899 Other long term (current) drug therapy: Secondary | ICD-10-CM | POA: Diagnosis not present

## 2018-12-11 DIAGNOSIS — F4323 Adjustment disorder with mixed anxiety and depressed mood: Secondary | ICD-10-CM | POA: Diagnosis not present

## 2019-01-07 DIAGNOSIS — N6489 Other specified disorders of breast: Secondary | ICD-10-CM | POA: Diagnosis not present

## 2019-01-08 DIAGNOSIS — C50412 Malignant neoplasm of upper-outer quadrant of left female breast: Secondary | ICD-10-CM | POA: Diagnosis not present

## 2019-01-08 DIAGNOSIS — Z170 Estrogen receptor positive status [ER+]: Secondary | ICD-10-CM | POA: Diagnosis not present

## 2019-01-08 DIAGNOSIS — D0512 Intraductal carcinoma in situ of left breast: Secondary | ICD-10-CM | POA: Diagnosis not present

## 2019-04-09 DIAGNOSIS — D0512 Intraductal carcinoma in situ of left breast: Secondary | ICD-10-CM

## 2019-04-09 DIAGNOSIS — Z170 Estrogen receptor positive status [ER+]: Secondary | ICD-10-CM

## 2019-04-09 DIAGNOSIS — C50412 Malignant neoplasm of upper-outer quadrant of left female breast: Secondary | ICD-10-CM

## 2019-07-29 IMAGING — CR DG CERVICAL SPINE 1V
1 series · 1 of 1 positions shown · non-contrast
Comparison: Cervical spine x-rays dated November 05, 2015.

CLINICAL DATA: CT C4 ACDF.

EXAM:
CERVICAL SPINE 1 VIEW

[xtable lateral]
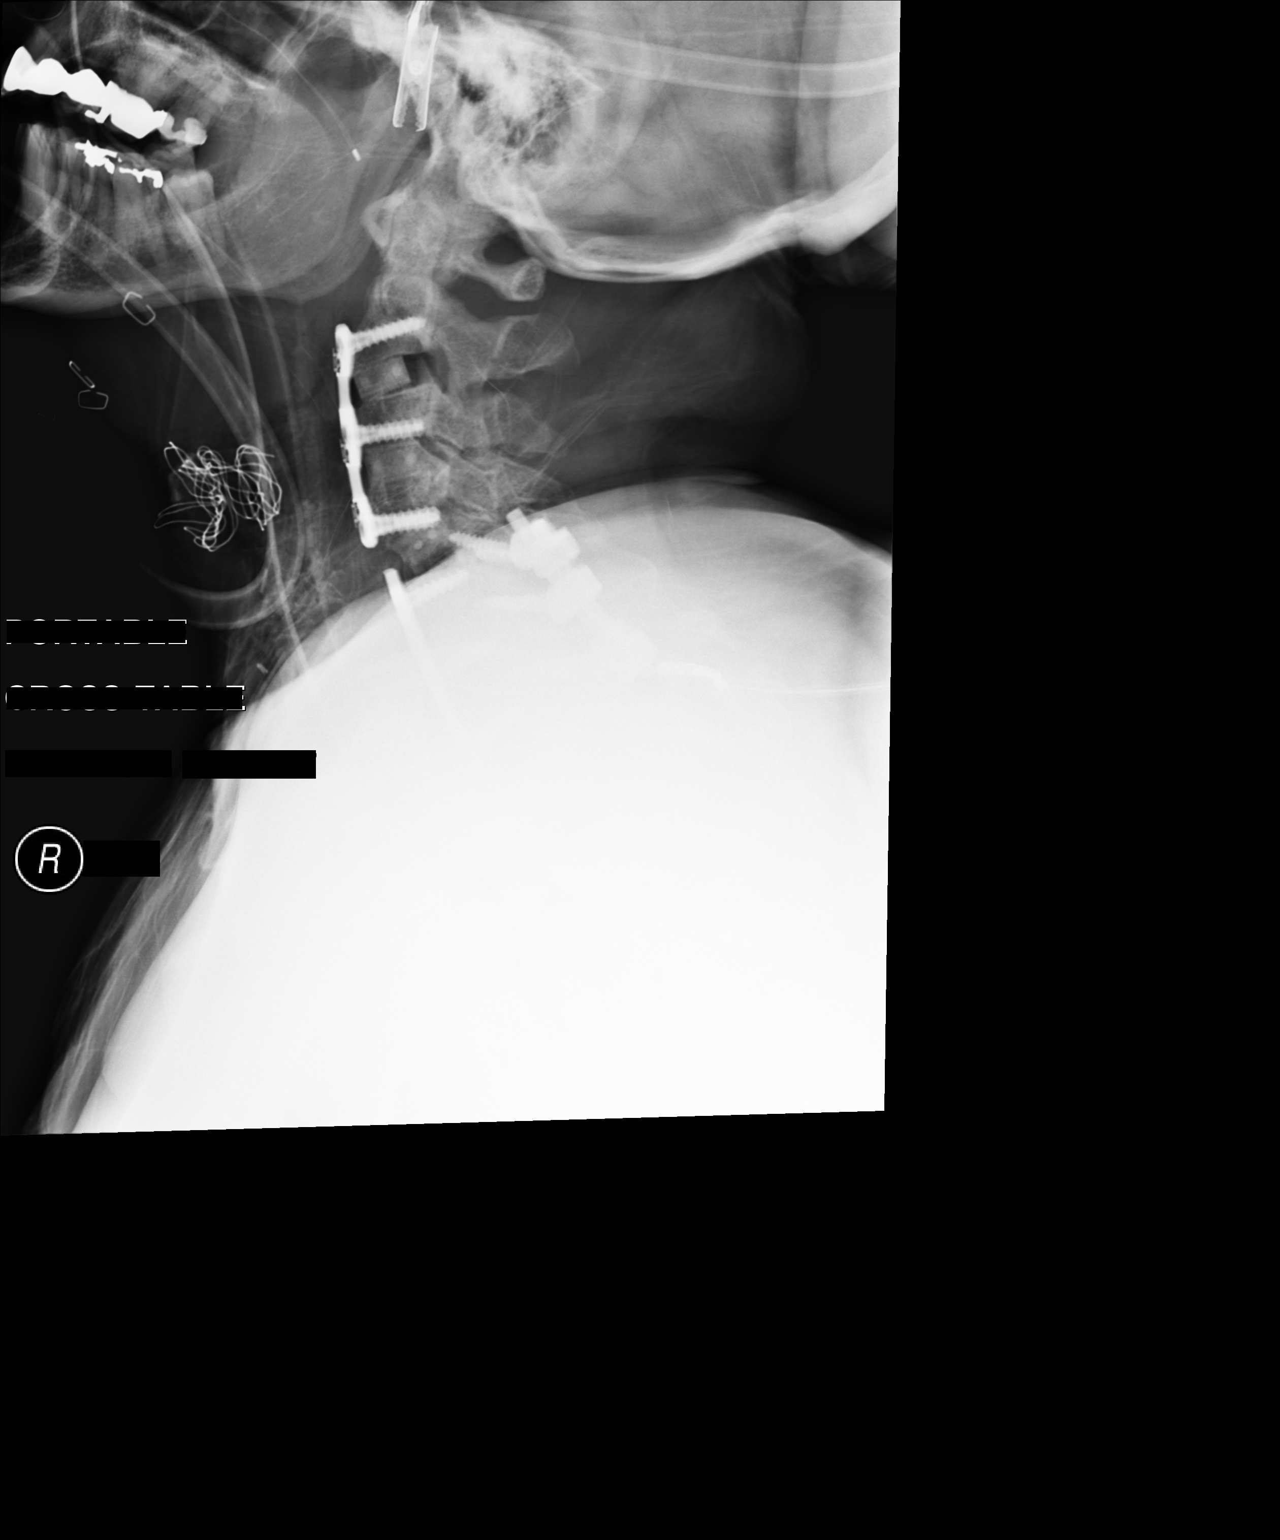

[1 of 1 positions shown; findings below may reference images not displayed]

FINDINGS: Single intraoperative lateral x-ray demonstrating interval C2-C4
ACDF with interbody disc spacers, and removal of the anterior plate
spanning the C4-C5 level. C5-C7 ACDF and posterior fusion is
partially seen due to overlapping soft tissues. Alignment is
anatomic. No definite hardware complication.
IMPRESSION: 1. Interval C2-C4 ACDF and removal of anterior plate spanning the
C4-C5 level. No definite hardware complication.
2. Partially visualized C5-C7 ACDF and posterior fusion.

## 2019-12-10 ENCOUNTER — Inpatient Hospital Stay: Payer: Medicare Other | Admitting: Oncology

## 2019-12-25 ENCOUNTER — Telehealth: Payer: Self-pay | Admitting: Oncology

## 2019-12-25 NOTE — Telephone Encounter (Signed)
Rescheduled Patient's 11/24 Appt Time to 3:45 pm due to working in [place] Patient at 1:15 pm - [facility] per patient

## 2019-12-29 NOTE — Progress Notes (Signed)
[facility]  [address] [place],  [place]  [address] [phone]  Clinic Day:  12/29/2019  Referring physician: Myer Peer, MD   This document serves as a record of services personally performed by Hosie Poisson, MD. It was created on their behalf by [facility], a trained medical scribe. The creation of this record is based on the scribe's personal observations and the provider's statements to them.   CHIEF COMPLAINT:  CC: Stage IA hormone receptor positive left breast cancer  Current Treatment:  Tamoxifen   HISTORY OF PRESENT ILLNESS:  Paula Graves is a 69 y.o. female with stage I A (T1 N0 M0) hormone receptor positive left breast diagnosed in March 2020. Screening mammogram in February 2020 revealed calcifications of the left breast.  Diagnostic left mammogram confirmed fine pleomorphic calcifications in the upper outer quadrant at middle depth, in a linear orientation towards the nipple, spanning 3.5 cm.  Biopsy of this area in March revealed intermediate grade ductal carcinoma in situ with no invasive cancer seen.  Estrogen receptors were positive at 80% and progesterone receptor negative.  There was no palpable lesion.  She was treated with lumpectomy in April 2020. 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  Estrogen receptors were positive at 95%.  Progesterone receptors and HER 2 were negative.  Ki 67 was 15%.  She later underwent sentinel lymph node and 2 sentinel nodes were negative for metastasis.  She received adjuvant radiation to the left breast completed in June.  She was placed on tamoxifen in July 2020.  She was seen in September 2020 for acute mastitis of the left breast treated with Cleocin.  She also had squamous cell skin cancers removed from her left leg in February.    She had a right breast ultrasound at the time of her bilateral diagnostic mammogram, which did not reveal any abnormality in the right breast.  However, left diagnostic mammogram in June revealed probable developing dystrophic calcifications associated with fat necrosis in the lumpectomy bed, so left diagnostic mammogram in 6 months was recommended.     INTERVAL HISTORY:  Paula Graves is here for routine follow up and states that she is well and denies complaints.  She is scheduled for unilateral left mammogram on December 29th to follow up on the dystrophic calcifications associated with fat necrosis in the lumpectomy bed. She undergoes routine lab work at Dr. Roberts Gaudy' office with her annual physical on December 30th.  Her  appetite is good, and she has lost 4 pounds since her last visit.  She denies fever, chills or other signs of infection.  She denies nausea, vomiting, bowel issues, or abdominal pain.  She denies sore throat, cough, dyspnea, or chest pain.   REVIEW OF SYSTEMS:  Review of Systems  Constitutional: Negative.   HENT:  Negative.   Eyes: Negative.   Respiratory: Negative.   Cardiovascular: Negative.   Gastrointestinal: Negative.   Endocrine: Negative.   Genitourinary: Negative.    Musculoskeletal: Negative.   Skin: Negative.   Neurological: Negative.   Hematological: Negative.   Psychiatric/Behavioral: Negative.      VITALS:  There were no vitals taken for this visit.  Wt Readings from Last 3 Encounters:  08/08/19 144 lb 4.8 oz (65.5 kg)  11/07/17 146 lb (66.2 kg)  12/20/16 146 lb (66.2 kg)  There is no height or weight on file to calculate BMI.  Performance status (ECOG): 0 - Asymptomatic  PHYSICAL EXAM:  Physical Exam Constitutional:      General: She is not in acute distress.    Appearance: Normal appearance. She is normal weight.  HENT:     Head: Normocephalic and atraumatic.  Eyes:     General: No scleral icterus.    Extraocular Movements: Extraocular  movements intact.     Conjunctiva/sclera: Conjunctivae normal.     Pupils: Pupils are equal, round, and reactive to light.  Cardiovascular:     Rate and Rhythm: Normal rate and regular rhythm.     Pulses: Normal pulses.     Heart sounds: Normal heart sounds. No murmur heard.  No friction rub. No gallop.   Pulmonary:     Effort: Pulmonary effort is normal. No respiratory distress.     Breath sounds: Normal breath sounds.  Chest:     Breasts:        Right: Normal.        Left: Normal.     Comments: Deep scar in the left lateral breast which is well healed at about 2 o'clock. Abdominal:     General: Bowel sounds are normal. There is no distension.     Palpations: Abdomen is soft. There is no mass.     Tenderness: There is no abdominal tenderness.  Musculoskeletal:        General: Normal range of motion.     Cervical back: Normal range of motion and neck supple.     Right lower leg: No edema.     Left lower leg: No edema.  Lymphadenopathy:     Cervical: No cervical adenopathy.  Skin:    General: Skin is warm and dry.  Neurological:     General: No focal deficit present.     Mental Status: She is alert and oriented to person, place, and time. Mental status is at baseline.  Psychiatric:        Mood and Affect: Mood normal.        Behavior: Behavior normal.        Thought Content: Thought content normal.        Judgment: Judgment normal.     LABS:   CBC Latest Ref Rng & Units 12/18/2016 12/24/2007 12/14/2006  WBC 4.0 - 10.5 K/uL 8.0 10.6(H) 8.7  Hemoglobin 12.0 - 15.0 g/dL 11.8(L) 14.4 14.2  Hematocrit 36 - 46 % 36.2 42.3 41.8  Platelets 150 - 400 K/uL 335 270 297   CMP Latest Ref Rng & Units 12/18/2016  Glucose 65 - 99 mg/dL 157(H)  BUN 6 - 20 mg/dL 15  Creatinine 0.44 - 1.00 mg/dL 1.57(H)  Sodium 135 - 145 mmol/L 139  Potassium 3.5 - 5.1 mmol/L 4.1  Chloride 101 - 111 mmol/L 103  CO2 22 - 32 mmol/L 27  Calcium 8.9 - 10.3 mg/dL 9.8    STUDIES:  No results found.    Allergies:  Allergies  Allergen Reactions  . Codeine Nausea And Vomiting and Other (See Comments)    Stomach pain/ cramps  . Keflex [Cephalexin] Nausea And Vomiting and Other (See Comments)    dizziness  . Morphine And Related Nausea And Vomiting    Current Medications: Current Outpatient Medications  Medication Sig Dispense Refill  . amLODipine (NORVASC) 10 MG tablet Take 10 mg by mouth daily.    Marland Kitchen amlodipine-atorvastatin (CADUET) 10-10 MG tablet Take 1 tablet by mouth daily.    Marland Kitchen  Ascorbic Acid (VITAMIN C) 1000 MG tablet Take 1,000 mg daily by mouth.    . cyclobenzaprine (FLEXERIL) 10 MG tablet Take 5-10 mg 3 (three) times daily as needed by mouth for muscle spasms (5-10 depends on pain).    Marland Kitchen dicyclomine (BENTYL) 20 MG tablet Take 20 mg every 6 (six) hours as needed by mouth for spasms.    . furosemide (LASIX) 20 MG tablet Take 20 mg by mouth.    . gabapentin (NEURONTIN) 300 MG capsule Take by mouth daily. Strength unknown    . HYDROcodone-acetaminophen (NORCO/VICODIN) 5-325 MG tablet Take 1-2 tablets every 4 (four) hours as needed by mouth (pain). 50 tablet 0  . LORazepam (ATIVAN) 1 MG tablet Take 1 mg 2 (two) times daily as needed by mouth for anxiety.    Marland Kitchen losartan (COZAAR) 25 MG tablet Take 25 mg by mouth daily.    . Misc Natural Products (OSTEO BI-FLEX ADV TRIPLE ST) TABS Take 1 tablet daily by mouth.    . pantoprazole (PROTONIX) 40 MG tablet TAKE 1 TABLET(40 MG) BY MOUTH DAILY 90 tablet 4  . simvastatin (ZOCOR) 40 MG tablet Take 40 mg daily at 6 PM by mouth.    . SUMAtriptan (IMITREX) 100 MG tablet Take 100 mg every 2 (two) hours as needed by mouth for migraine. May repeat in 2 hours if headache persists or recurs.    . venlafaxine (EFFEXOR) 75 MG tablet Take 75 mg 3 (three) times daily with meals by mouth.     No current facility-administered medications for this visit.     ASSESSMENT & PLAN:   Assessment:   1.  Stage IA (T1aN0M0) estrogen receptor positive left  breast cancer, diagnosed in March 2020.  She received adjuvant radiation to the left breast, and was placed on tamoxifen in July 2020, and will continue this for a total of 5 years.  She remains without evidence of recurrence.    Plan: Diagnostic mammogram from June revealed probable developing dystrophic calcifications in the left breast, so she is schedule for unilateral left mammogram on December 29th.  She knows to continue tamoxifen for a total of 5 years.  She has had a hysterectomy.  We will plan to see her back in 4 months for re-examination.  At that point she will be 2 years post op and we can go to 6 month follow up.  The patient understands the plans discussed today and is in agreement with them.  She knows to contact our office if she develops concerns regarding her breast cancer or its treatment.   I provided 10 minutes of face-to-face time during this this encounter and > 50% was spent counseling as documented under my assessment and plan.    Derwood Kaplan, MD [facility] [address] [place] [address] Dept: [phone] Dept Fax: [phone]   I, Rita Ohara, am acting as scribe for Derwood Kaplan, MD  I have reviewed this report as typed by the medical scribe, and it is complete and accurate.

## 2019-12-31 ENCOUNTER — Telehealth: Payer: Self-pay | Admitting: Oncology

## 2019-12-31 ENCOUNTER — Inpatient Hospital Stay: Payer: Medicare Other | Attending: Oncology | Admitting: Oncology

## 2019-12-31 ENCOUNTER — Other Ambulatory Visit: Payer: Self-pay

## 2019-12-31 VITALS — BP 139/69 | HR 73 | Temp 98.8°F | Resp 16 | Ht 62.0 in | Wt 140.1 lb

## 2019-12-31 DIAGNOSIS — C50412 Malignant neoplasm of upper-outer quadrant of left female breast: Secondary | ICD-10-CM

## 2019-12-31 DIAGNOSIS — Z170 Estrogen receptor positive status [ER+]: Secondary | ICD-10-CM | POA: Diagnosis not present

## 2019-12-31 NOTE — Telephone Encounter (Signed)
Per 11/24 los 89mth f/u appt given to patient

## 2020-01-04 ENCOUNTER — Encounter: Payer: Self-pay | Admitting: Oncology

## 2020-02-09 DIAGNOSIS — Z23 Encounter for immunization: Secondary | ICD-10-CM | POA: Diagnosis not present

## 2020-02-16 DIAGNOSIS — Z9181 History of falling: Secondary | ICD-10-CM | POA: Diagnosis not present

## 2020-02-16 DIAGNOSIS — K219 Gastro-esophageal reflux disease without esophagitis: Secondary | ICD-10-CM | POA: Diagnosis not present

## 2020-02-16 DIAGNOSIS — C50912 Malignant neoplasm of unspecified site of left female breast: Secondary | ICD-10-CM | POA: Diagnosis not present

## 2020-02-16 DIAGNOSIS — I10 Essential (primary) hypertension: Secondary | ICD-10-CM | POA: Diagnosis not present

## 2020-02-16 DIAGNOSIS — Z0000 Encounter for general adult medical examination without abnormal findings: Secondary | ICD-10-CM | POA: Diagnosis not present

## 2020-02-16 DIAGNOSIS — E782 Mixed hyperlipidemia: Secondary | ICD-10-CM | POA: Diagnosis not present

## 2020-02-16 DIAGNOSIS — E119 Type 2 diabetes mellitus without complications: Secondary | ICD-10-CM | POA: Diagnosis not present

## 2020-04-09 DIAGNOSIS — Z041 Encounter for examination and observation following transport accident: Secondary | ICD-10-CM | POA: Diagnosis not present

## 2020-04-09 DIAGNOSIS — M542 Cervicalgia: Secondary | ICD-10-CM | POA: Diagnosis not present
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
## 2020-04-29 ENCOUNTER — Telehealth: Payer: Self-pay | Admitting: Oncology

## 2020-04-29 ENCOUNTER — Encounter: Payer: Self-pay | Admitting: Oncology

## 2020-04-29 ENCOUNTER — Other Ambulatory Visit: Payer: Self-pay

## 2020-04-29 ENCOUNTER — Inpatient Hospital Stay: Payer: Medicare Other | Attending: Oncology | Admitting: Oncology

## 2020-04-29 VITALS — BP 125/73 | HR 85 | Temp 98.2°F | Resp 18 | Ht 62.0 in | Wt 141.1 lb

## 2020-04-29 DIAGNOSIS — C50412 Malignant neoplasm of upper-outer quadrant of left female breast: Secondary | ICD-10-CM

## 2020-04-29 DIAGNOSIS — Z170 Estrogen receptor positive status [ER+]: Secondary | ICD-10-CM

## 2020-04-29 NOTE — Telephone Encounter (Signed)
Per 3/24 LOS, patient scheduled for Sept Appt's.  Gave patient Appt Summary

## 2020-05-25 DIAGNOSIS — N189 Chronic kidney disease, unspecified: Secondary | ICD-10-CM | POA: Diagnosis not present

## 2020-05-25 DIAGNOSIS — L814 Other melanin hyperpigmentation: Secondary | ICD-10-CM | POA: Diagnosis not present

## 2020-05-25 DIAGNOSIS — D1801 Hemangioma of skin and subcutaneous tissue: Secondary | ICD-10-CM | POA: Diagnosis not present

## 2020-05-25 DIAGNOSIS — D225 Melanocytic nevi of trunk: Secondary | ICD-10-CM | POA: Diagnosis not present

## 2020-05-25 DIAGNOSIS — L820 Inflamed seborrheic keratosis: Secondary | ICD-10-CM | POA: Diagnosis not present

## 2020-05-25 DIAGNOSIS — N183 Chronic kidney disease, stage 3 unspecified: Secondary | ICD-10-CM | POA: Diagnosis not present

## 2020-05-25 DIAGNOSIS — D2239 Melanocytic nevi of other parts of face: Secondary | ICD-10-CM | POA: Diagnosis not present

## 2020-06-03 DIAGNOSIS — N183 Chronic kidney disease, stage 3 unspecified: Secondary | ICD-10-CM | POA: Diagnosis not present

## 2020-06-03 DIAGNOSIS — D631 Anemia in chronic kidney disease: Secondary | ICD-10-CM | POA: Diagnosis not present

## 2020-06-03 DIAGNOSIS — N2581 Secondary hyperparathyroidism of renal origin: Secondary | ICD-10-CM | POA: Diagnosis not present

## 2020-06-03 DIAGNOSIS — M7542 Impingement syndrome of left shoulder: Secondary | ICD-10-CM | POA: Diagnosis not present

## 2020-06-03 DIAGNOSIS — I129 Hypertensive chronic kidney disease with stage 1 through stage 4 chronic kidney disease, or unspecified chronic kidney disease: Secondary | ICD-10-CM | POA: Diagnosis not present

## 2020-07-03 ENCOUNTER — Other Ambulatory Visit: Payer: Self-pay | Admitting: Hematology and Oncology

## 2020-07-03 DIAGNOSIS — C50412 Malignant neoplasm of upper-outer quadrant of left female breast: Secondary | ICD-10-CM

## 2020-08-05 DIAGNOSIS — R921 Mammographic calcification found on diagnostic imaging of breast: Secondary | ICD-10-CM | POA: Diagnosis not present

## 2020-08-05 DIAGNOSIS — R928 Other abnormal and inconclusive findings on diagnostic imaging of breast: Secondary | ICD-10-CM | POA: Diagnosis not present

## 2020-08-05 DIAGNOSIS — C50412 Malignant neoplasm of upper-outer quadrant of left female breast: Secondary | ICD-10-CM | POA: Diagnosis not present

## 2020-08-05 DIAGNOSIS — R922 Inconclusive mammogram: Secondary | ICD-10-CM | POA: Diagnosis not present

## 2020-08-05 DIAGNOSIS — Z853 Personal history of malignant neoplasm of breast: Secondary | ICD-10-CM | POA: Diagnosis not present

## 2020-08-10 DIAGNOSIS — Z20822 Contact with and (suspected) exposure to covid-19: Secondary | ICD-10-CM | POA: Diagnosis not present

## 2020-08-17 ENCOUNTER — Other Ambulatory Visit: Payer: Self-pay

## 2020-09-14 DIAGNOSIS — C50412 Malignant neoplasm of upper-outer quadrant of left female breast: Secondary | ICD-10-CM | POA: Diagnosis not present

## 2020-09-14 DIAGNOSIS — Z170 Estrogen receptor positive status [ER+]: Secondary | ICD-10-CM | POA: Diagnosis not present

## 2020-10-11 DIAGNOSIS — Z20822 Contact with and (suspected) exposure to covid-19: Secondary | ICD-10-CM | POA: Diagnosis not present

## 2020-10-27 ENCOUNTER — Telehealth: Payer: Self-pay | Admitting: Oncology

## 2020-10-27 NOTE — Telephone Encounter (Signed)
10/27/20 LEFT MSG TO [facility] APPT

## 2020-10-29 ENCOUNTER — Inpatient Hospital Stay: Payer: Medicare Other | Admitting: Oncology

## 2020-11-05 ENCOUNTER — Ambulatory Visit: Payer: Medicare Other | Admitting: Oncology

## 2020-11-05 ENCOUNTER — Ambulatory Visit: Payer: Medicare Other | Admitting: Hematology and Oncology

## 2020-11-08 ENCOUNTER — Telehealth: Payer: Self-pay | Admitting: Hematology and Oncology

## 2020-11-08 ENCOUNTER — Inpatient Hospital Stay: Payer: Medicare Other | Attending: Oncology | Admitting: Hematology and Oncology

## 2020-11-08 ENCOUNTER — Encounter: Payer: Self-pay | Admitting: Hematology and Oncology

## 2020-11-08 DIAGNOSIS — C50412 Malignant neoplasm of upper-outer quadrant of left female breast: Secondary | ICD-10-CM | POA: Diagnosis not present

## 2020-11-08 DIAGNOSIS — Z23 Encounter for immunization: Secondary | ICD-10-CM | POA: Diagnosis not present

## 2020-11-08 DIAGNOSIS — Z170 Estrogen receptor positive status [ER+]: Secondary | ICD-10-CM | POA: Diagnosis not present

## 2020-11-08 NOTE — Progress Notes (Signed)
[facility]  [address] [place],  [place]  [address] [phone]  Clinic Day:  11/08/2020  Referring physician: Cyndy Freeze, MD  ASSESSMENT & PLAN:   Assessment & Plan: Malignant neoplasm of upper-outer quadrant of left female breast [facility]) She remains without evidence of recurrence.  Bilateral diagnostic mammogram in June revealed stable, probably benign calcifications in the region of the left breast that lumpectomy site.  There was no evidence of malignancy in either breast.  Bilateral diagnostic mammogram in 1 year with magnification views of the left breast lumpectomy site was recommended.  She knows to continue tamoxifen daily. We will plan to see her back in 4 months for re-examination.  If all is well at that time, we will likely go to 6 month followups.   The patient understands the plans discussed today and is in agreement with them.  She knows to contact our office if she develops concerns prior to her next appointment.    Paula Graves, Paula Graves  [facility] AT [facility] [address] [place] [address] Dept: [phone] Dept Fax: [phone]   No orders of the defined types were placed in this encounter.     CHIEF COMPLAINT:  CC: Stage IA hormone receptor positive breast cancer  Current Treatment:  Tamoxifen   HISTORY OF PRESENT ILLNESS:   Oncology History  Malignant neoplasm of upper-outer quadrant of left female breast ([place])  05/08/2018 Initial Diagnosis   Malignant neoplasm of upper-outer quadrant of left female breast ([place])   05/08/2018 Cancer Staging   Staging form: Breast, AJCC 8th Edition - Clinical stage from 05/08/2018: Stage 0 (cTis (DCIS), cN0, cM0, G2, ER+, PR-, HER2-) - Signed by Derwood Kaplan, MD on 01/04/2020   05/08/2018 Cancer Staging   Staging form: Breast, AJCC 8th Edition - Pathologic stage from 05/08/2018: Stage IA (pT1a, pN0(sn), cM0, G2, ER+,  PR-, HER2-) - Signed by Derwood Kaplan, MD on 01/04/2020 Prognostic indicators: occ foci of invasive ca, up to 76m      INTERVAL HISTORY:  [name] here today for repeat clinical assessment. She states she continues tamoxifen daily without significant difficulty. She denies any changes in her breasts. She reports mild depression and anxiety controlled with venlafaxine. She denies fevers or chills. She denies pain. Her appetite is good. Her weight has increased 4 pounds over last 4 months . Bilateral diagnostic mammogram in June did not reveal any evidence of malignancy. There were probable benign calcifications within the left lumpectomy site. She is up to date on screening colonocsopy.  REVIEW OF SYSTEMS:  Review of Systems  Constitutional:  Negative for appetite change, chills, fatigue, fever and unexpected weight change.  HENT:   Negative for lump/mass, mouth sores and sore throat.   Respiratory:  Negative for cough and shortness of breath.   Cardiovascular:  Negative for chest pain and leg swelling.  Gastrointestinal:  Negative for abdominal pain, constipation, diarrhea, nausea and vomiting.  Endocrine: Negative for hot flashes.  Genitourinary:  Negative for difficulty urinating, dysuria, frequency and hematuria.   Musculoskeletal:  Negative for arthralgias, back pain and myalgias.  Skin:  Negative for rash.  Neurological:  Negative for dizziness and headaches.  Hematological:  Negative for adenopathy. Does not bruise/bleed easily.  Psychiatric/Behavioral:  Negative for depression and sleep disturbance. The patient is not nervous/anxious.     VITALS:  Blood pressure (!) 145/73, pulse 96, temperature 98.8 F (37.1 C), temperature source Oral,  resp. rate 18, height 5' 2"  (1.575 m), weight 143 lb 12.8 oz (65.2 kg), SpO2 99 %.  Wt Readings from Last 3 Encounters:  11/08/20 143 lb 12.8 oz (65.2 kg)  04/29/20 141 lb 1.6 oz (64 kg)  12/31/19 140 lb 1.6 oz (63.5 kg)    Body mass index  is 26.3 kg/m.  Performance status (ECOG): 0 - Asymptomatic  PHYSICAL EXAM:  Physical Exam Vitals and nursing note reviewed.  Constitutional:      General: She is not in acute distress.    Appearance: Normal appearance.  HENT:     Head: Normocephalic and atraumatic.     Mouth/Throat:     Mouth: Mucous membranes are moist.     Pharynx: Oropharynx is clear. No oropharyngeal exudate or posterior oropharyngeal erythema.  Eyes:     General: No scleral icterus.    Extraocular Movements: Extraocular movements intact.     Conjunctiva/sclera: Conjunctivae normal.     Pupils: Pupils are equal, round, and reactive to light.  Cardiovascular:     Rate and Rhythm: Normal rate and regular rhythm.     Heart sounds: Normal heart sounds. No murmur heard.   No friction rub. No gallop.  Pulmonary:     Effort: Pulmonary effort is normal.     Breath sounds: Normal breath sounds. No wheezing, rhonchi or rales.  Chest:  Breasts:    Right: Normal. No swelling, bleeding, inverted nipple, mass, nipple discharge, skin change or tenderness.     Left: Normal. No swelling, bleeding, inverted nipple, mass, nipple discharge, skin change or tenderness.  Abdominal:     General: There is no distension.     Palpations: Abdomen is soft. There is no hepatomegaly, splenomegaly or mass.     Tenderness: There is no abdominal tenderness.  Musculoskeletal:        General: Normal range of motion.     Cervical back: Normal range of motion and neck supple. No tenderness.     Right lower leg: No edema.     Left lower leg: No edema.  Lymphadenopathy:     Cervical: No cervical adenopathy.     Upper Body:     Right upper body: No supraclavicular or axillary adenopathy.     Left upper body: No supraclavicular or axillary adenopathy.     Lower Body: No right inguinal adenopathy. No left inguinal adenopathy.  Skin:    General: Skin is warm and dry.     Coloration: Skin is not jaundiced.     Findings: No rash.   Neurological:     Mental Status: She is alert and oriented to person, place, and time.     Cranial Nerves: No cranial nerve deficit.  Psychiatric:        Mood and Affect: Mood normal.        Behavior: Behavior normal.        Thought Content: Thought content normal.    LABS:   CBC Latest Ref Rng & Units 12/18/2016 12/24/2007 12/14/2006  WBC 4.0 - 10.5 K/uL 8.0 10.6(H) 8.7  Hemoglobin 12.0 - 15.0 g/dL 11.8(L) 14.4 14.2  Hematocrit 36.0 - 46.0 % 36.2 42.3 41.8  Platelets 150 - 400 K/uL 335 270 297   CMP Latest Ref Rng & Units 12/18/2016  Glucose 65 - 99 mg/dL 157(H)  BUN 6 - 20 mg/dL 15  Creatinine 0.44 - 1.00 mg/dL 1.57(H)  Sodium 135 - 145 mmol/L 139  Potassium 3.5 - 5.1 mmol/L 4.1  Chloride 101 - 111  mmol/L 103  CO2 22 - 32 mmol/L 27  Calcium 8.9 - 10.3 mg/dL 9.8     No results found for: CEA1 / No results found for: CEA1 No results found for: PSA1 No results found for: QPR916 No results found for: CAN125  No results found for: TOTALPROTELP, ALBUMINELP, A1GS, A2GS, BETS, BETA2SER, GAMS, MSPIKE, SPEI No results found for: TIBC, FERRITIN, IRONPCTSAT No results found for: LDH  STUDIES:  No results found.    HISTORY:   Past Medical History:  Diagnosis Date   Anxiety    Arthritis    Chronic kidney disease    elevated creatinine  10/18   having renal studies- renal sonagram 12/15/16 dr patel  [facility] req   Complication of anesthesia    had panic attack prior to first neck surgery   Diabetes mellitus without complication ([place])    type II  d/c'd metformion 10/18 due to elevated creatinine-having renal studies   Hypertension    dr d/c'd bp meds in 10/18 due to creatinine elevated- sonagram renal 12/15/16   Pneumonia    hx    Past Surgical History:  Procedure Laterality Date   ABDOMINAL HYSTERECTOMY     ANTERIOR CERVICAL DECOMP/DISCECTOMY FUSION N/A 12/20/2016   Procedure: PARTIAL REMOVAL TETHER CERVICAL PLATE, ANTERIOR CERVICAL DECOMPRESSION/DISCECTOMY  FUSION  CERVICAL TWO- CERVICAL THREE, CERVICAL THREE-CERVICAL FOUR ;  Surgeon: Jovita Gamma, MD;  Location: [facility];  Service: Neurosurgery;  Laterality: N/A;   bladder tack     CARPAL TUNNEL RELEASE Left 2006   CERVICAL DISC SURGERY     08,09   CHOLECYSTECTOMY  80's   COLONOSCOPY  04/22/2009   Colonic polyp, status post polypectomy.    ESOPHAGOGASTRODUODENOSCOPY  04/22/2009   Moderate gastritis. Otherwise normal EGD.    TONSILLECTOMY     child   TUBAL LIGATION      Family History  Problem Relation Age of Onset   Heart disease Mother    Diabetes Maternal Grandmother    Stomach cancer Paternal Uncle     Social History:  reports that she has never smoked. She has never used smokeless tobacco. She reports that she does not drink alcohol and does not use drugs.The patient is alone today.  Allergies:  Allergies  Allergen Reactions   Codeine Nausea And Vomiting and Other (See Comments)    Stomach pain/ cramps Other reaction(s): Cramps (ALLERGY/intolerance), Other (See Comments) Stomach pain Stomach pain/ cramps   Keflex [Cephalexin] Nausea And Vomiting and Other (See Comments)    dizziness   Morphine Nausea And Vomiting   Morphine And Related Nausea And Vomiting    Current Medications: Current Outpatient Medications  Medication Sig Dispense Refill   amitriptyline (ELAVIL) 75 MG tablet Take 75 mg by mouth at bedtime.     amLODipine (NORVASC) 10 MG tablet Take 10 mg by mouth daily.     Ascorbic Acid (VITAMIN C) 1000 MG tablet Take 1,000 mg daily by mouth.     cyclobenzaprine (FLEXERIL) 10 MG tablet Take 5-10 mg 3 (three) times daily as needed by mouth for muscle spasms (5-10 depends on pain).     dicyclomine (BENTYL) 20 MG tablet Take 20 mg every 6 (six) hours as needed by mouth for spasms.     furosemide (LASIX) 20 MG tablet Take 20 mg by mouth.     gabapentin (NEURONTIN) 300 MG capsule Take by mouth daily. Strength unknown     HYDROcodone-acetaminophen (NORCO/VICODIN)  5-325 MG tablet Take 1-2 tablets every 4 (four) hours  as needed by mouth (pain). 50 tablet 0   LORazepam (ATIVAN) 1 MG tablet Take 1 mg 2 (two) times daily as needed by mouth for anxiety.     losartan (COZAAR) 25 MG tablet Take 25 mg by mouth daily.     Misc Natural Products (OSTEO BI-FLEX ADV TRIPLE ST) TABS Take 1 tablet daily by mouth.     ondansetron (ZOFRAN) 4 MG tablet      oxyCODONE (OXY IR/ROXICODONE) 5 MG immediate release tablet Take 5 mg by mouth every 6 (six) hours as needed.     pantoprazole (PROTONIX) 40 MG tablet TAKE 1 TABLET(40 MG) BY MOUTH DAILY 90 tablet 4   simvastatin (ZOCOR) 40 MG tablet Take 40 mg daily at 6 PM by mouth.     SUMAtriptan (IMITREX) 100 MG tablet Take 100 mg every 2 (two) hours as needed by mouth for migraine. May repeat in 2 hours if headache persists or recurs.     tamoxifen (NOLVADEX) 20 MG tablet TAKE 1 TABLET BY MOUTH DAILY 90 tablet 3   tiZANidine (ZANAFLEX) 4 MG capsule TAKE 1 CAPSULE BY MOUTH THREE TIMES DAILY AS NEEDED FOR MUSCLE SPASMS     venlafaxine (EFFEXOR) 75 MG tablet Take 75 mg 3 (three) times daily with meals by mouth.     No current facility-administered medications for this visit.

## 2020-11-08 NOTE — Assessment & Plan Note (Signed)
She remains without evidence of recurrence.  Bilateral diagnostic mammogram in June revealed stable, probably benign calcifications in the region of the left breast that lumpectomy site.  There was no evidence of malignancy in either breast.  Bilateral diagnostic mammogram in 1 year with magnification views of the left breast lumpectomy site was recommended.  She knows to continue tamoxifen daily. We will plan to see her back in 4 months for re-examination.  If all is well at that time, we will likely go to 6 month followups.

## 2020-11-08 NOTE — Telephone Encounter (Signed)
Patient called to verify today's Appt °

## 2020-11-09 ENCOUNTER — Telehealth: Payer: Self-pay | Admitting: Hematology and Oncology

## 2020-12-09 DIAGNOSIS — Z23 Encounter for immunization: Secondary | ICD-10-CM | POA: Diagnosis not present

## 2020-12-13 DIAGNOSIS — Z780 Asymptomatic menopausal state: Secondary | ICD-10-CM | POA: Diagnosis not present

## 2020-12-13 DIAGNOSIS — F4323 Adjustment disorder with mixed anxiety and depressed mood: Secondary | ICD-10-CM | POA: Diagnosis not present

## 2020-12-13 DIAGNOSIS — D0512 Intraductal carcinoma in situ of left breast: Secondary | ICD-10-CM | POA: Diagnosis not present

## 2020-12-13 DIAGNOSIS — Z13820 Encounter for screening for osteoporosis: Secondary | ICD-10-CM | POA: Diagnosis not present

## 2020-12-13 DIAGNOSIS — Z6828 Body mass index (BMI) 28.0-28.9, adult: Secondary | ICD-10-CM | POA: Diagnosis not present

## 2021-02-23 DIAGNOSIS — E1165 Type 2 diabetes mellitus with hyperglycemia: Secondary | ICD-10-CM | POA: Diagnosis not present

## 2021-02-23 DIAGNOSIS — C50912 Malignant neoplasm of unspecified site of left female breast: Secondary | ICD-10-CM | POA: Diagnosis not present

## 2021-02-23 DIAGNOSIS — K219 Gastro-esophageal reflux disease without esophagitis: Secondary | ICD-10-CM | POA: Diagnosis not present

## 2021-02-23 DIAGNOSIS — I10 Essential (primary) hypertension: Secondary | ICD-10-CM | POA: Diagnosis not present

## 2021-02-23 DIAGNOSIS — N1832 Chronic kidney disease, stage 3b: Secondary | ICD-10-CM | POA: Diagnosis not present

## 2021-02-23 DIAGNOSIS — E119 Type 2 diabetes mellitus without complications: Secondary | ICD-10-CM | POA: Diagnosis not present

## 2021-02-23 DIAGNOSIS — E782 Mixed hyperlipidemia: Secondary | ICD-10-CM | POA: Diagnosis not present

## 2021-02-23 DIAGNOSIS — Z0000 Encounter for general adult medical examination without abnormal findings: Secondary | ICD-10-CM | POA: Diagnosis not present

## 2021-03-07 NOTE — Progress Notes (Signed)
[place]  [address] [place],    [address] [phone]  Clinic Day:  03/11/2021  Referring physician: Serita Grammes, MD   This document serves as a record of services personally performed by Hosie Poisson, MD. It was created on their behalf by Curry,Lauren E, a trained medical scribe. The creation of this record is based on the scribe's personal observations and the provider's statements to them.  CHIEF COMPLAINT:  CC: Stage IA hormone receptor positive left breast cancer  Current Treatment:  Tamoxifen for a total of 5 years   HISTORY OF PRESENT ILLNESS:  Paula Graves is a 71 y.o. female with stage I A (T1 N0 M0) hormone receptor positive left breast diagnosed in March 2020. Screening mammogram in February 2020 revealed calcifications of the left breast.  Diagnostic left mammogram confirmed fine pleomorphic calcifications in the upper outer quadrant at middle depth, in a linear orientation towards the nipple, spanning 3.5 cm.  Biopsy of this area in March revealed intermediate grade ductal carcinoma in situ with no invasive cancer seen.  Estrogen receptors were positive at 80% and progesterone receptor negative.  There was no palpable lesion.  She was treated with lumpectomy in April 2020. 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  Estrogen receptors were positive at 95%.  Progesterone receptors and HER 2 were negative.  Ki 67 was 15%.  She later underwent sentinel lymph node and 2 sentinel nodes were negative for metastasis.  She received adjuvant radiation to the left breast completed in June.  She was placed on tamoxifen in July 2020.  She was seen in September 2020 for acute mastitis of the left breast treated with Cleocin.  She also had squamous cell skin cancers removed from her  left leg in February.   She had a right breast ultrasound at the time of her bilateral diagnostic mammogram, which did not reveal any abnormality in the right breast.  However, left diagnostic mammogram in June revealed probable developing dystrophic calcifications associated with fat necrosis in the lumpectomy bed, so left diagnostic mammogram in 6 months was recommended.  Diagnostic left mammogram from December revealed interval decreased conspicuity of probably benign calcifications at the left lumpectomy site, likely postsurgical.    INTERVAL HISTORY:  Paula Graves is here for routine follow up and states that she has been doing well other than acid reflux. She has been using OTC Nexium and TUMS with improvement. Bilateral mammogram from June 2022 revealed stable probably benign calcifications in the region of the left breast lumpectomy site. There is no mammographic evidence of malignancy in either breast. Repeat examination in 1 year was recommended. Her  appetite is good, and she has a stable weight since her last visit.  She denies fever, chills or other signs of infection.  She denies nausea, vomiting, bowel issues, or abdominal pain.  She denies sore throat, cough, dyspnea, or chest pain.  REVIEW OF SYSTEMS:  Review of Systems  Constitutional: Negative.  Negative for appetite change, chills, fatigue, fever and unexpected weight change.  HENT:  Negative.    Eyes: Negative.   Respiratory: Negative.  Negative for chest tightness, cough, hemoptysis, shortness of breath and wheezing.   Cardiovascular: Negative.  Negative for chest pain, leg swelling and palpitations.  Gastrointestinal: Negative.  Negative for abdominal distention, abdominal pain, blood in stool, constipation, diarrhea, nausea  and vomiting.       Acid reflux  Endocrine: Negative.   Genitourinary: Negative.  Negative for difficulty urinating, dysuria, frequency and hematuria.   Musculoskeletal: Negative.  Negative for arthralgias, back  pain, flank pain, gait problem and myalgias.  Skin: Negative.   Neurological: Negative.  Negative for dizziness, extremity weakness, gait problem, headaches, light-headedness, numbness, seizures and speech difficulty.  Hematological: Negative.   Psychiatric/Behavioral: Negative.  Negative for depression and sleep disturbance. The patient is not nervous/anxious.    VITALS:  Blood pressure 119/60, pulse 86, temperature 98.4 F (36.9 C), temperature source Oral, resp. rate 14, height 5\' 2"  (1.575 m), weight 142 lb 3.2 oz (64.5 kg), SpO2 96 %.  Wt Readings from Last 3 Encounters:  03/11/21 142 lb 3.2 oz (64.5 kg)  11/08/20 143 lb 12.8 oz (65.2 kg)  04/29/20 141 lb 1.6 oz (64 kg)    Body mass index is 26.01 kg/m.  Performance status (ECOG): 1 - Symptomatic but completely ambulatory  PHYSICAL EXAM:  Physical Exam Constitutional:      General: She is not in acute distress.    Appearance: Normal appearance. She is normal weight.  HENT:     Head: Normocephalic and atraumatic.  Eyes:     General: No scleral icterus.    Extraocular Movements: Extraocular movements intact.     Conjunctiva/sclera: Conjunctivae normal.     Pupils: Pupils are equal, round, and reactive to light.  Cardiovascular:     Rate and Rhythm: Normal rate and regular rhythm.     Pulses: Normal pulses.     Heart sounds: Normal heart sounds. No murmur heard.   No friction rub. No gallop.  Pulmonary:     Effort: Pulmonary effort is normal. No respiratory distress.     Breath sounds: Normal breath sounds.  Chest:     Comments: Deep well healed scar in the upper outer quadrant of the left breast. No masses in either breast. Abdominal:     General: Bowel sounds are normal. There is no distension.     Palpations: Abdomen is soft. There is no hepatomegaly, splenomegaly or mass.     Tenderness: There is no abdominal tenderness.  Musculoskeletal:        General: Normal range of motion.     Cervical back: Normal range of  motion and neck supple.     Right lower leg: No edema.     Left lower leg: No edema.  Lymphadenopathy:     Cervical: No cervical adenopathy.  Skin:    General: Skin is warm and dry.  Neurological:     General: No focal deficit present.     Mental Status: She is alert and oriented to person, place, and time. Mental status is at baseline.  Psychiatric:        Mood and Affect: Mood normal.        Behavior: Behavior normal.        Thought Content: Thought content normal.        Judgment: Judgment normal.    LABS:   CBC Latest Ref Rng & Units 12/18/2016 12/24/2007 12/14/2006  WBC 4.0 - 10.5 K/uL 8.0 10.6(H) 8.7  Hemoglobin 12.0 - 15.0 g/dL 11.8(L) 14.4 14.2  Hematocrit 36.0 - 46.0 % 36.2 42.3 41.8  Platelets 150 - 400 K/uL 335 270 297   CMP Latest Ref Rng & Units 12/18/2016  Glucose 65 - 99 mg/dL 157(H)  BUN 6 - 20 mg/dL 15  Creatinine 0.44 - 1.00 mg/dL 1.57(H)  Sodium 135 - 145 mmol/L 139  Potassium 3.5 - 5.1 mmol/L 4.1  Chloride 101 - 111 mmol/L 103  CO2 22 - 32 mmol/L 27  Calcium 8.9 - 10.3 mg/dL 9.8    STUDIES:    EXAM: 08/05/2020 DIGITAL DIAGNOSTIC BILATERAL MAMMOGRAM WITH TOMOSYNTHESIS AND CAD   TECHNIQUE:  Bilateral digital diagnostic mammography and breast tomosynthesis  was performed. The images were evaluated with computer-aided  detection.   COMPARISON: Previous exams.   ACR Breast Density Category b: There are scattered areas of  fibroglandular density.   FINDINGS:  No suspicious masses or calcifications are seen in either breast.  Lumpectomy changes again identified in the upper-outer left breast.  Spot compression magnification views of the left breast lumpectomy  site were performed. Stable appearance of the probably benign  calcifications at the left breast lumpectomy site, some of which are  clearly vascular. There is no mammographic evidence of malignancy in  either breast.   IMPRESSION:  Stable probably benign calcifications in the region of  the left  breast lumpectomy site. There is no mammographic evidence of  malignancy in either breast.   RECOMMENDATION:  Bilateral diagnostic mammography in 1 year with magnification views  of the left breast lumpectomy site which will demonstrate 2 years of  stability of probably benign left breast calcifications.    Allergies:  Allergies  Allergen Reactions   Codeine Nausea And Vomiting and Other (See Comments)    Stomach pain/ cramps Other reaction(s): Cramps (ALLERGY/intolerance), Other (See Comments) Stomach pain Stomach pain/ cramps   Keflex [Cephalexin] Nausea And Vomiting and Other (See Comments)    dizziness   Morphine Nausea And Vomiting   Morphine And Related Nausea And Vomiting    Current Medications: Current Outpatient Medications  Medication Sig Dispense Refill   amitriptyline (ELAVIL) 75 MG tablet Take 75 mg by mouth at bedtime.     amLODipine (NORVASC) 10 MG tablet Take 10 mg by mouth daily.     Ascorbic Acid (VITAMIN C) 1000 MG tablet Take 1,000 mg daily by mouth.     cyclobenzaprine (FLEXERIL) 10 MG tablet Take 5-10 mg 3 (three) times daily as needed by mouth for muscle spasms (5-10 depends on pain).     dicyclomine (BENTYL) 20 MG tablet Take 20 mg every 6 (six) hours as needed by mouth for spasms.     furosemide (LASIX) 20 MG tablet Take 20 mg by mouth.     losartan (COZAAR) 25 MG tablet Take 25 mg by mouth daily.     simvastatin (ZOCOR) 40 MG tablet Take 40 mg daily at 6 PM by mouth.     tamoxifen (NOLVADEX) 20 MG tablet TAKE 1 TABLET BY MOUTH DAILY 90 tablet 3   venlafaxine (EFFEXOR) 75 MG tablet Take 75 mg 3 (three) times daily with meals by mouth.     LORazepam (ATIVAN) 1 MG tablet Take 1 mg 2 (two) times daily as needed by mouth for anxiety. (Patient not taking: Reported on 03/11/2021)     Misc Natural Products (OSTEO BI-FLEX ADV TRIPLE ST) TABS Take 1 tablet daily by mouth. (Patient not taking: Reported on 03/11/2021)     pantoprazole (PROTONIX) 40 MG tablet  TAKE 1 TABLET(40 MG) BY MOUTH DAILY (Patient not taking: Reported on 03/11/2021) 90 tablet 4   SUMAtriptan (IMITREX) 100 MG tablet Take 100 mg every 2 (two) hours as needed by mouth for migraine. May repeat in 2 hours if headache persists or recurs. (Patient not taking: Reported on 03/11/2021)  No current facility-administered medications for this visit.     ASSESSMENT & PLAN:   Assessment:   1.  Stage IA (T1aN0M0) estrogen receptor positive left breast cancer, diagnosed in March 2020.  She received adjuvant radiation to the left breast, and was placed on tamoxifen in July 2020, and will continue this for a total of 5 years.  She remains without evidence of recurrence.    2.  Status post hysterectomy.  3.  Calcifications of the left lumpectomy site, which remain stable.    Plan: She knows to continue tamoxifen daily for a total of 5 years.  We will plan to see her back in 1 year for re-examination.  She will continue to follow up with Dr. Noberto Retort in the summer for routine mammography and examination.  The patient understands the plans discussed today and is in agreement with them.  She knows to contact our office if she develops concerns regarding her breast cancer or its treatment.   I provided 10 minutes of face-to-face time during this this encounter and > 50% was spent counseling as documented under my assessment and plan.    Derwood Kaplan, MD [facility] AT [facility] [address] [place] [address] Dept: [phone] Dept Fax: [phone]   I, Rita Ohara, am acting as scribe for Derwood Kaplan, MD  I have reviewed this report as typed by the medical scribe, and it is complete and accurate.

## 2021-03-11 ENCOUNTER — Inpatient Hospital Stay: Payer: Medicare Other | Attending: Oncology | Admitting: Oncology

## 2021-03-11 ENCOUNTER — Telehealth: Payer: Self-pay | Admitting: Oncology

## 2021-03-11 ENCOUNTER — Encounter: Payer: Self-pay | Admitting: Oncology

## 2021-03-11 VITALS — BP 119/60 | HR 86 | Temp 98.4°F | Resp 14 | Ht 62.0 in | Wt 142.2 lb

## 2021-03-11 DIAGNOSIS — Z170 Estrogen receptor positive status [ER+]: Secondary | ICD-10-CM

## 2021-03-11 DIAGNOSIS — C50412 Malignant neoplasm of upper-outer quadrant of left female breast: Secondary | ICD-10-CM | POA: Diagnosis not present

## 2021-03-11 NOTE — Telephone Encounter (Signed)
Per 03/11/21 los next appt scheduled and confirmed with patient

## 2021-04-11 DIAGNOSIS — S060X0A Concussion without loss of consciousness, initial encounter: Secondary | ICD-10-CM | POA: Diagnosis not present

## 2021-04-11 DIAGNOSIS — Z043 Encounter for examination and observation following other accident: Secondary | ICD-10-CM | POA: Diagnosis not present

## 2021-04-20 DIAGNOSIS — Z20822 Contact with and (suspected) exposure to covid-19: Secondary | ICD-10-CM | POA: Diagnosis not present

## 2021-04-26 DIAGNOSIS — N1831 Chronic kidney disease, stage 3a: Secondary | ICD-10-CM | POA: Diagnosis not present

## 2021-04-26 DIAGNOSIS — E785 Hyperlipidemia, unspecified: Secondary | ICD-10-CM | POA: Diagnosis not present

## 2021-05-03 DIAGNOSIS — D631 Anemia in chronic kidney disease: Secondary | ICD-10-CM | POA: Diagnosis not present

## 2021-05-03 DIAGNOSIS — Z20822 Contact with and (suspected) exposure to covid-19: Secondary | ICD-10-CM | POA: Diagnosis not present

## 2021-05-03 DIAGNOSIS — I129 Hypertensive chronic kidney disease with stage 1 through stage 4 chronic kidney disease, or unspecified chronic kidney disease: Secondary | ICD-10-CM | POA: Diagnosis not present

## 2021-05-03 DIAGNOSIS — N183 Chronic kidney disease, stage 3 unspecified: Secondary | ICD-10-CM | POA: Diagnosis not present

## 2021-05-03 DIAGNOSIS — N2581 Secondary hyperparathyroidism of renal origin: Secondary | ICD-10-CM | POA: Diagnosis not present

## 2021-05-26 DIAGNOSIS — Z20822 Contact with and (suspected) exposure to covid-19: Secondary | ICD-10-CM | POA: Diagnosis not present

## 2021-05-30 DIAGNOSIS — R070 Pain in throat: Secondary | ICD-10-CM | POA: Diagnosis not present

## 2021-05-30 DIAGNOSIS — K1120 Sialoadenitis, unspecified: Secondary | ICD-10-CM | POA: Diagnosis not present

## 2021-05-31 DIAGNOSIS — D2239 Melanocytic nevi of other parts of face: Secondary | ICD-10-CM | POA: Diagnosis not present

## 2021-05-31 DIAGNOSIS — D225 Melanocytic nevi of trunk: Secondary | ICD-10-CM | POA: Diagnosis not present

## 2021-05-31 DIAGNOSIS — L820 Inflamed seborrheic keratosis: Secondary | ICD-10-CM | POA: Diagnosis not present

## 2021-05-31 DIAGNOSIS — L821 Other seborrheic keratosis: Secondary | ICD-10-CM | POA: Diagnosis not present

## 2021-05-31 DIAGNOSIS — D485 Neoplasm of uncertain behavior of skin: Secondary | ICD-10-CM | POA: Diagnosis not present

## 2021-05-31 DIAGNOSIS — L814 Other melanin hyperpigmentation: Secondary | ICD-10-CM | POA: Diagnosis not present

## 2021-06-02 DIAGNOSIS — Z20822 Contact with and (suspected) exposure to covid-19: Secondary | ICD-10-CM | POA: Diagnosis not present

## 2021-08-08 DIAGNOSIS — R928 Other abnormal and inconclusive findings on diagnostic imaging of breast: Secondary | ICD-10-CM | POA: Diagnosis not present

## 2021-08-08 DIAGNOSIS — C50412 Malignant neoplasm of upper-outer quadrant of left female breast: Secondary | ICD-10-CM | POA: Diagnosis not present

## 2021-08-15 DIAGNOSIS — Z170 Estrogen receptor positive status [ER+]: Secondary | ICD-10-CM | POA: Diagnosis not present

## 2021-08-15 DIAGNOSIS — C50412 Malignant neoplasm of upper-outer quadrant of left female breast: Secondary | ICD-10-CM | POA: Diagnosis not present

## 2021-08-18 ENCOUNTER — Other Ambulatory Visit: Payer: Self-pay | Admitting: Hematology and Oncology

## 2021-08-18 DIAGNOSIS — Z170 Estrogen receptor positive status [ER+]: Secondary | ICD-10-CM

## 2021-09-13 DIAGNOSIS — D485 Neoplasm of uncertain behavior of skin: Secondary | ICD-10-CM | POA: Diagnosis not present

## 2021-10-19 DIAGNOSIS — C44722 Squamous cell carcinoma of skin of right lower limb, including hip: Secondary | ICD-10-CM | POA: Diagnosis not present

## 2021-11-08 DIAGNOSIS — R051 Acute cough: Secondary | ICD-10-CM | POA: Diagnosis not present

## 2021-11-08 DIAGNOSIS — R0602 Shortness of breath: Secondary | ICD-10-CM | POA: Diagnosis not present

## 2021-11-08 DIAGNOSIS — Z03818 Encounter for observation for suspected exposure to other biological agents ruled out: Secondary | ICD-10-CM | POA: Diagnosis not present

## 2021-11-08 DIAGNOSIS — R531 Weakness: Secondary | ICD-10-CM | POA: Diagnosis not present

## 2021-11-23 DIAGNOSIS — Z23 Encounter for immunization: Secondary | ICD-10-CM | POA: Diagnosis not present

## 2022-01-10 DIAGNOSIS — Z23 Encounter for immunization: Secondary | ICD-10-CM | POA: Diagnosis not present

## 2022-03-09 NOTE — Progress Notes (Signed)
Paula Graves  [address] [place],  [place]  [address] [phone]  Clinic Day:  03/10/2022  Referring physician: Serita Grammes, MD     CHIEF COMPLAINT:  CC: Stage IA hormone receptor positive left breast cancer  Current Treatment:  Tamoxifen for a total of 5 years  HISTORY OF PRESENT ILLNESS:  Paula Graves is a 72 y.o. female with stage I A (T1 N0 M0) hormone receptor positive left breast diagnosed in March 2020. Screening mammogram in February 2020 revealed calcifications of the left breast.  Diagnostic left mammogram confirmed fine pleomorphic calcifications in the upper outer quadrant at middle depth, in a linear orientation towards the nipple, spanning 3.5 cm.  Biopsy of this area in March revealed intermediate grade ductal carcinoma in situ with no invasive cancer seen.  Estrogen receptors were positive at 80% and progesterone receptor negative.  There was no palpable lesion.  She was treated with lumpectomy in April 2020. 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  Estrogen receptors were positive at 95%.  Progesterone receptors and HER 2 were negative.  Ki 67 was 15%.  She later underwent sentinel lymph node and 2 sentinel nodes were negative for metastasis.  She received adjuvant radiation to the left breast completed in June.  She was placed on tamoxifen in July 2020.  She was seen in September 2020 for acute mastitis of the left breast treated with Cleocin.  She also had squamous cell skin cancers removed from her left leg in February.   She had a right breast ultrasound at the time of her bilateral diagnostic mammogram, which did not reveal any abnormality in the right breast.  However, left diagnostic mammogram in June 2021 revealed probable developing dystrophic calcifications associated with  fat necrosis in the lumpectomy bed, so left diagnostic mammogram in 6 months was recommended.  Diagnostic left mammogram in December revealed interval decreased conspicuity of probably benign calcifications at the left lumpectomy site, likely postsurgical.  Bilateral diagnostic mammogram in 6 months was recommended.  This was done in June 2022 and revealed stable probably benign calcifications in the area of left lumpectomy site.  Bilateral diagnostic mammogram 1 year was recommended.  INTERVAL HISTORY:  Paula Graves is here for annual follow up. She states she continues tamoxifen daily without difficulty. She denies any changes in her breasts.  She has had some pain under her left breast without rash.  Her appetite is good.  Her weight has decreased 3 pounds in the past year.  She denies fever or chills.  She denies pain.  She requests a refill of her tamoxifen today.  She states she had her annual physical with Dr. Melody Haver today and had labs done.  She states bone density was not done, but she has had a previous bone density scan.  She underwent bilateral diagnostic mammogram in July 2023 which revealed stable post lumpectomy changes in the left breast.  Bilateral screening mammogram in 1 year was recommended.  She continues to follow annually with Dr. Lourena Simmonds who orders her mammograms.  REVIEW OF SYSTEMS:  Review of Systems  Constitutional:  Negative for appetite change, chills, fatigue, fever and unexpected weight change.  HENT:   Negative for lump/mass, mouth sores and sore throat.   Respiratory:  Negative for cough and shortness of breath.   Cardiovascular:  Negative for chest pain  and leg swelling.  Gastrointestinal:  Negative for abdominal pain, constipation, diarrhea, nausea and vomiting.  Endocrine: Negative for hot flashes.  Genitourinary:  Negative for difficulty urinating, dysuria, frequency and hematuria.   Musculoskeletal:  Negative for arthralgias, back pain and myalgias.  Skin:  Negative for  rash.  Neurological:  Negative for dizziness and headaches.  Hematological:  Negative for adenopathy. Does not bruise/bleed easily.  Psychiatric/Behavioral:  Negative for depression and sleep disturbance. The patient is not nervous/anxious.     VITALS:  Blood pressure 113/64, pulse 91, temperature 99.7 F (37.6 C), temperature source Oral, resp. rate 20, height 5' 2"$  (1.575 m), weight 139 lb 4.8 oz (63.2 kg), SpO2 99 %.  Wt Readings from Last 3 Encounters:  03/10/22 139 lb 4.8 oz (63.2 kg)  03/11/21 142 lb 3.2 oz (64.5 kg)  11/08/20 143 lb 12.8 oz (65.2 kg)    Body mass index is 25.48 kg/m.  Performance status (ECOG): 1 - Symptomatic but completely ambulatory  PHYSICAL EXAM:  Physical Exam Vitals and nursing note reviewed.  Constitutional:      General: She is not in acute distress.    Appearance: Normal appearance.  HENT:     Head: Normocephalic and atraumatic.     Mouth/Throat:     Mouth: Mucous membranes are moist.     Pharynx: Oropharynx is clear. No oropharyngeal exudate or posterior oropharyngeal erythema.  Eyes:     General: No scleral icterus.    Extraocular Movements: Extraocular movements intact.     Conjunctiva/sclera: Conjunctivae normal.     Pupils: Pupils are equal, round, and reactive to light.  Cardiovascular:     Rate and Rhythm: Normal rate and regular rhythm.     Heart sounds: Normal heart sounds. No murmur heard.    No friction rub. No gallop.  Pulmonary:     Effort: Pulmonary effort is normal.     Breath sounds: Normal breath sounds. No wheezing, rhonchi or rales.  Chest:  Breasts:    Right: Normal. No inverted nipple, mass, nipple discharge or skin change.     Left: Normal. No inverted nipple, mass, nipple discharge or skin change.     Comments: Stable scarring of the lumpectomy bed Abdominal:     General: There is no distension.     Palpations: Abdomen is soft. There is no hepatomegaly, splenomegaly or mass.     Tenderness: There is no  abdominal tenderness.  Musculoskeletal:        General: Normal range of motion.     Cervical back: Normal range of motion and neck supple. No tenderness.     Right lower leg: No edema.     Left lower leg: No edema.  Lymphadenopathy:     Cervical: No cervical adenopathy.     Upper Body:     Right upper body: No supraclavicular or axillary adenopathy.     Left upper body: No supraclavicular or axillary adenopathy.     Lower Body: No right inguinal adenopathy. No left inguinal adenopathy.  Skin:    General: Skin is warm and dry.     Coloration: Skin is not jaundiced.     Findings: No rash.  Neurological:     Mental Status: She is alert and oriented to person, place, and time.     Cranial Nerves: No cranial nerve deficit.  Psychiatric:        Mood and Affect: Mood normal.        Behavior: Behavior normal.  Thought Content: Thought content normal.     LABS:      Latest Ref Rng & Units 12/18/2016   11:42 AM 12/24/2007    1:49 PM 12/14/2006   11:53 AM  CBC  WBC 4.0 - 10.5 K/uL 8.0  10.6  8.7   Hemoglobin 12.0 - 15.0 g/dL 11.8  14.4  14.2   Hematocrit 36.0 - 46.0 % 36.2  42.3  41.8   Platelets 150 - 400 K/uL 335  270  297       Latest Ref Rng & Units 12/18/2016   11:42 AM  CMP  Glucose 65 - 99 mg/dL 157   BUN 6 - 20 mg/dL 15   Creatinine 0.44 - 1.00 mg/dL 1.57   Sodium 135 - 145 mmol/L 139   Potassium 3.5 - 5.1 mmol/L 4.1   Chloride 101 - 111 mmol/L 103   CO2 22 - 32 mmol/L 27   Calcium 8.9 - 10.3 mg/dL 9.8     STUDIES:   Exam(s): 0703-0031 MAM/MAM DIGITAL W/TOMO DIAG B  CLINICAL DATA: Patient presents for bilateral diagnostic  examination due to history of previous left malignant lumpectomy  2020.   EXAM:  DIGITAL DIAGNOSTIC BILATERAL MAMMOGRAM WITH TOMOSYNTHESIS AND CAD  TECHNIQUE:  Bilateral digital diagnostic mammography and breast tomosynthesis  was performed. The images were evaluated with computer-aided  detection.   COMPARISON: Previous exam(s).    ACR Breast Density Category b: There are scattered areas of  fibroglandular density.   FINDINGS:  Exam demonstrates stable post lumpectomy changes over the upper  outer quadrant of the left breast. Remainder of the left breast as  well as the right breast is unchanged.   IMPRESSION:  Stable post lumpectomy changes of the left breast.   RECOMMENDATION:  Recommend continued annual bilateral screening mammographic  follow-up.   I have discussed the findings and recommendations with the patient.  If applicable, a reminder letter will be sent to the patient  regarding the next appointment.   BI-RADS CATEGORY 2: Benign.    Allergies:  Allergies  Allergen Reactions   Codeine Nausea And Vomiting and Other (See Comments)    Stomach pain/ cramps Other reaction(s): Cramps (ALLERGY/intolerance), Other (See Comments) Stomach pain Stomach pain/ cramps   Keflex [Cephalexin] Nausea And Vomiting and Other (See Comments)    dizziness   Morphine Nausea And Vomiting   Morphine And Related Nausea And Vomiting    Current Medications: Current Outpatient Medications  Medication Sig Dispense Refill   ondansetron (ZOFRAN) 4 MG tablet Take by mouth.     amitriptyline (ELAVIL) 75 MG tablet Take 75 mg by mouth at bedtime.     amLODipine (NORVASC) 10 MG tablet Take 10 mg by mouth daily.     Ascorbic Acid (VITAMIN C) 1000 MG tablet Take 1,000 mg daily by mouth.     cyclobenzaprine (FLEXERIL) 10 MG tablet Take 5-10 mg 3 (three) times daily as needed by mouth for muscle spasms (5-10 depends on pain).     furosemide (LASIX) 20 MG tablet Take 20 mg by mouth.     LORazepam (ATIVAN) 1 MG tablet Take 1 mg 2 (two) times daily as needed by mouth for anxiety. (Patient not taking: Reported on 03/11/2021)     losartan (COZAAR) 25 MG tablet Take 25 mg by mouth daily.     simvastatin (ZOCOR) 40 MG tablet Take 40 mg daily at 6 PM by mouth.     tamoxifen (NOLVADEX) 20 MG tablet Take 1 tablet (20 mg  total) by  mouth daily. 90 tablet 3   venlafaxine (EFFEXOR) 75 MG tablet Take 75 mg 3 (three) times daily with meals by mouth.     No current facility-administered medications for this visit.     ASSESSMENT & PLAN:   Assessment:   1.  Stage IA (T1aN0M0) estrogen receptor positive left breast cancer diagnosed in March 2020.  She was treated with lumpectomy and adjuvant radiation to the left breast.  She was placed on tamoxifen in July 2020 with plans to continue for a total of 5 years.  She remains without evidence of recurrence.    2.  Status post hysterectomy.  3.  Calcifications of the left lumpectomy site, which remain stable.    Plan: She knows to continue tamoxifen daily for a total of 5 years.  We will plan to see her back in 1 year for repeat examination.  She will continue to follow up with Dr. Noberto Retort in the summer for routine mammography and examination.  The patient understands the plans discussed today and is in agreement with them.  She knows to contact our office if she develops concerns regarding her breast cancer or its treatment.   I provided 15 minutes of face-to-face time during this this encounter and > 50% was spent counseling as documented under my assessment and plan.    Marvia Pickles, PA-C [facility] AT [facility] [address] [place] [address] Dept: [phone] Dept Fax: [phone]

## 2022-03-10 ENCOUNTER — Encounter: Payer: Self-pay | Admitting: Hematology and Oncology

## 2022-03-10 ENCOUNTER — Inpatient Hospital Stay: Payer: Medicare Other | Attending: Hematology and Oncology | Admitting: Hematology and Oncology

## 2022-03-10 DIAGNOSIS — C50412 Malignant neoplasm of upper-outer quadrant of left female breast: Secondary | ICD-10-CM

## 2022-03-10 DIAGNOSIS — Z79899 Other long term (current) drug therapy: Secondary | ICD-10-CM | POA: Diagnosis not present

## 2022-03-10 DIAGNOSIS — Z170 Estrogen receptor positive status [ER+]: Secondary | ICD-10-CM

## 2022-03-10 DIAGNOSIS — E1165 Type 2 diabetes mellitus with hyperglycemia: Secondary | ICD-10-CM | POA: Diagnosis not present

## 2022-03-10 DIAGNOSIS — G6282 Radiation-induced polyneuropathy: Secondary | ICD-10-CM | POA: Diagnosis not present

## 2022-03-10 DIAGNOSIS — E782 Mixed hyperlipidemia: Secondary | ICD-10-CM | POA: Diagnosis not present

## 2022-03-10 DIAGNOSIS — Z0000 Encounter for general adult medical examination without abnormal findings: Secondary | ICD-10-CM | POA: Diagnosis not present

## 2022-03-10 DIAGNOSIS — Z1331 Encounter for screening for depression: Secondary | ICD-10-CM | POA: Diagnosis not present

## 2022-03-10 DIAGNOSIS — N1832 Chronic kidney disease, stage 3b: Secondary | ICD-10-CM | POA: Diagnosis not present

## 2022-03-10 DIAGNOSIS — N3001 Acute cystitis with hematuria: Secondary | ICD-10-CM | POA: Diagnosis not present

## 2022-03-10 MED ORDER — TAMOXIFEN CITRATE 20 MG PO TABS: 20.0000 mg | ORAL_TABLET | Freq: Every day | ORAL | 3 refills | Status: DC

## 2022-04-25 DIAGNOSIS — N183 Chronic kidney disease, stage 3 unspecified: Secondary | ICD-10-CM | POA: Diagnosis not present

## 2022-04-25 DIAGNOSIS — N2581 Secondary hyperparathyroidism of renal origin: Secondary | ICD-10-CM | POA: Diagnosis not present

## 2022-05-03 DIAGNOSIS — N183 Chronic kidney disease, stage 3 unspecified: Secondary | ICD-10-CM | POA: Diagnosis not present

## 2022-05-03 DIAGNOSIS — I129 Hypertensive chronic kidney disease with stage 1 through stage 4 chronic kidney disease, or unspecified chronic kidney disease: Secondary | ICD-10-CM | POA: Diagnosis not present

## 2022-05-03 DIAGNOSIS — D631 Anemia in chronic kidney disease: Secondary | ICD-10-CM | POA: Diagnosis not present

## 2022-05-03 DIAGNOSIS — E1122 Type 2 diabetes mellitus with diabetic chronic kidney disease: Secondary | ICD-10-CM | POA: Diagnosis not present

## 2022-05-26 NOTE — Telephone Encounter (Signed)
DOne

## 2022-08-14 DIAGNOSIS — Z1231 Encounter for screening mammogram for malignant neoplasm of breast: Secondary | ICD-10-CM | POA: Diagnosis not present

## 2022-08-14 LAB — HM MAMMOGRAPHY

## 2022-09-04 DIAGNOSIS — Z170 Estrogen receptor positive status [ER+]: Secondary | ICD-10-CM | POA: Diagnosis not present

## 2022-09-04 DIAGNOSIS — C50412 Malignant neoplasm of upper-outer quadrant of left female breast: Secondary | ICD-10-CM | POA: Diagnosis not present

## 2022-09-12 DIAGNOSIS — N3090 Cystitis, unspecified without hematuria: Secondary | ICD-10-CM | POA: Diagnosis not present

## 2022-09-12 DIAGNOSIS — N390 Urinary tract infection, site not specified: Secondary | ICD-10-CM | POA: Diagnosis not present

## 2022-09-19 ENCOUNTER — Telehealth: Payer: Self-pay

## 2022-09-19 NOTE — Patient Outreach (Signed)
  Care Coordination   Initial Visit Note   09/19/2022 Name: Tate Logalbo MRN: [id number] DOB: 05/07/1950  Katy Fitch Glenice Dillner is a 72 y.o. year old female who sees Buckner Malta, MD for primary care. I spoke with  Sammie Bench by phone today.  What matters to the patients health and wellness today?  Placed call to patient to review and offer [facility] care coordination program. Patient reports that she is doing well and denies any current needs, Has follow up scheduled with PCP.     SDOH assessments and interventions completed:  No     Care Coordination Interventions:  No, not indicated   Follow up plan: No further intervention required.   Encounter Outcome:  Pt. Refused   Rowe Pavy, RN, BSN, CEN [facility] [phone]

## 2022-10-16 DIAGNOSIS — E1165 Type 2 diabetes mellitus with hyperglycemia: Secondary | ICD-10-CM | POA: Diagnosis not present

## 2022-10-16 DIAGNOSIS — E782 Mixed hyperlipidemia: Secondary | ICD-10-CM | POA: Diagnosis not present

## 2022-10-23 DIAGNOSIS — E1159 Type 2 diabetes mellitus with other circulatory complications: Secondary | ICD-10-CM | POA: Diagnosis not present

## 2022-10-23 DIAGNOSIS — N1832 Chronic kidney disease, stage 3b: Secondary | ICD-10-CM | POA: Diagnosis not present

## 2022-10-23 DIAGNOSIS — E1165 Type 2 diabetes mellitus with hyperglycemia: Secondary | ICD-10-CM | POA: Diagnosis not present

## 2022-10-23 DIAGNOSIS — I152 Hypertension secondary to endocrine disorders: Secondary | ICD-10-CM | POA: Diagnosis not present

## 2022-11-23 DIAGNOSIS — Z23 Encounter for immunization: Secondary | ICD-10-CM | POA: Diagnosis not present

## 2022-12-07 DIAGNOSIS — Z6824 Body mass index (BMI) 24.0-24.9, adult: Secondary | ICD-10-CM | POA: Diagnosis not present

## 2022-12-07 DIAGNOSIS — R8290 Unspecified abnormal findings in urine: Secondary | ICD-10-CM | POA: Diagnosis not present

## 2022-12-07 DIAGNOSIS — G43909 Migraine, unspecified, not intractable, without status migrainosus: Secondary | ICD-10-CM | POA: Diagnosis not present

## 2023-01-18 DIAGNOSIS — D485 Neoplasm of uncertain behavior of skin: Secondary | ICD-10-CM | POA: Diagnosis not present

## 2023-01-18 DIAGNOSIS — L814 Other melanin hyperpigmentation: Secondary | ICD-10-CM | POA: Diagnosis not present

## 2023-01-18 DIAGNOSIS — L821 Other seborrheic keratosis: Secondary | ICD-10-CM | POA: Diagnosis not present

## 2023-01-18 DIAGNOSIS — D2239 Melanocytic nevi of other parts of face: Secondary | ICD-10-CM | POA: Diagnosis not present

## 2023-01-18 DIAGNOSIS — D225 Melanocytic nevi of trunk: Secondary | ICD-10-CM | POA: Diagnosis not present

## 2023-03-13 ENCOUNTER — Ambulatory Visit: Payer: Medicare Other | Admitting: Oncology

## 2023-03-13 DIAGNOSIS — E119 Type 2 diabetes mellitus without complications: Secondary | ICD-10-CM | POA: Diagnosis not present

## 2023-03-13 DIAGNOSIS — I152 Hypertension secondary to endocrine disorders: Secondary | ICD-10-CM | POA: Diagnosis not present

## 2023-03-13 DIAGNOSIS — G6282 Radiation-induced polyneuropathy: Secondary | ICD-10-CM | POA: Diagnosis not present

## 2023-03-13 DIAGNOSIS — I10 Essential (primary) hypertension: Secondary | ICD-10-CM | POA: Diagnosis not present

## 2023-03-13 DIAGNOSIS — Z0000 Encounter for general adult medical examination without abnormal findings: Secondary | ICD-10-CM | POA: Diagnosis not present

## 2023-03-13 DIAGNOSIS — E785 Hyperlipidemia, unspecified: Secondary | ICD-10-CM | POA: Diagnosis not present

## 2023-03-13 DIAGNOSIS — E1159 Type 2 diabetes mellitus with other circulatory complications: Secondary | ICD-10-CM | POA: Diagnosis not present

## 2023-03-16 DIAGNOSIS — R519 Headache, unspecified: Secondary | ICD-10-CM | POA: Diagnosis not present

## 2023-03-19 ENCOUNTER — Other Ambulatory Visit: Payer: Self-pay | Admitting: Hematology and Oncology

## 2023-03-19 DIAGNOSIS — C50412 Malignant neoplasm of upper-outer quadrant of left female breast: Secondary | ICD-10-CM

## 2023-03-19 DIAGNOSIS — Z170 Estrogen receptor positive status [ER+]: Secondary | ICD-10-CM

## 2023-03-26 ENCOUNTER — Encounter: Payer: Self-pay | Admitting: Hematology and Oncology

## 2023-03-26 ENCOUNTER — Inpatient Hospital Stay: Payer: Medicare Other | Attending: Hematology and Oncology | Admitting: Hematology and Oncology

## 2023-03-26 VITALS — BP 109/72 | HR 83 | Temp 98.3°F | Resp 20 | Ht 62.0 in | Wt 140.3 lb

## 2023-03-26 DIAGNOSIS — Z1732 Human epidermal growth factor receptor 2 negative status: Secondary | ICD-10-CM | POA: Insufficient documentation

## 2023-03-26 DIAGNOSIS — Z170 Estrogen receptor positive status [ER+]: Secondary | ICD-10-CM | POA: Diagnosis not present

## 2023-03-26 DIAGNOSIS — Z79899 Other long term (current) drug therapy: Secondary | ICD-10-CM | POA: Diagnosis not present

## 2023-03-26 DIAGNOSIS — C50412 Malignant neoplasm of upper-outer quadrant of left female breast: Secondary | ICD-10-CM | POA: Insufficient documentation

## 2023-03-26 DIAGNOSIS — Z923 Personal history of irradiation: Secondary | ICD-10-CM | POA: Insufficient documentation

## 2023-03-26 DIAGNOSIS — Z1722 Progesterone receptor negative status: Secondary | ICD-10-CM | POA: Insufficient documentation

## 2023-03-26 NOTE — Assessment & Plan Note (Signed)
 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 She was treated with lumpectomy in April 2020.  She received adjuvant radiation to the left breast completed in June.  She was placed on tamoxifen in July 2020.  She was seen in September 2020 for acute mastitis of the left breast.  Left diagnostic mammogram in June 2021 revealed probable developing dystrophic calcifications associated with fat necrosis in the lumpectomy bed, 34-month follow-up was recommended. Left diagnostic mammogram in December revealed interval decreased conspicuity of probably benign calcifications at the left lumpectomy site, likely postsurgical, 18-month follow-up was recommended.  Bilateral diagnostic mammogram in June 2022  revealed stable probably benign calcifications in the area of left lumpectomy site.  Bilateral diagnostic mammogram in July 2023 revealed stable postlumpectomy changes of the left breast.  Bilateral screening mammogram in July 2024 did not reveal any evidence of malignancy.  She remains without evidence of recurrence.  She will complete 5 years of tamoxifen in July.  She continues to follow with Dr. Georgiana Shore and will see him with bilateral screening mammogram in July.  I will plan to see her back in 1 year in long-term survivorship clinic.

## 2023-03-26 NOTE — Progress Notes (Signed)
 [facility]  [address] [place],  [place]  [address] [phone]  Clinic Day:  03/26/2023  Referring physician: Buckner Malta, MD    CHIEF COMPLAINT:  CC: Stage IA estrogen receptor positive breast cancer  Current Treatment:  Tamoxifen 20 mg daily  HISTORY OF PRESENT ILLNESS:  Paula Graves is a 73 y.o. female with stage I A (T1 N0 M0) hormone receptor positive left breast diagnosed in March 2020. Screening mammogram in February 2020 revealed calcifications of the left breast.  Diagnostic left mammogram confirmed fine pleomorphic calcifications in the upper outer quadrant at middle depth, in a linear orientation towards the nipple, spanning 3.5 cm.  Biopsy of this area in March revealed intermediate grade ductal carcinoma in situ with no invasive cancer seen.  Estrogen receptors were positive at 80% and progesterone receptor negative.  There was no palpable lesion.  She was treated with lumpectomy in April 2020. 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  Estrogen receptors were positive at 95%.  Progesterone receptors and HER 2 were negative.  Ki 67 was 15%.  She later underwent sentinel lymph node and 2 sentinel nodes were negative for metastasis.  She received adjuvant radiation to the left breast completed in June.  She was placed on tamoxifen in July 2020.  She was seen in September 2020 for acute mastitis of the left breast treated with Cleocin.  She also had squamous cell skin cancers removed from her left leg in February.   She had a right breast ultrasound at the time of her bilateral diagnostic mammogram, which did not reveal any abnormality in the right breast.  However, left diagnostic mammogram in June 2021 revealed probable developing dystrophic calcifications associated with fat necrosis in the  lumpectomy bed, so left diagnostic mammogram in 6 months was recommended.  Diagnostic left mammogram in December revealed interval decreased conspicuity of probably benign calcifications at the left lumpectomy site, likely postsurgical.  Bilateral diagnostic mammogram in 6 months was recommended.  This was done in June 2022 and revealed stable probably benign calcifications in the area of left lumpectomy site.  Bilateral diagnostic mammogram 1 year was recommended.  Left diagnostic mammogram in June 2021 revealed probable developing dystrophic calcifications associated with fat necrosis in the lumpectomy bed, 63-month follow-up was recommended. Left diagnostic mammogram in December revealed interval decreased conspicuity of probably benign calcifications at the left lumpectomy site, likely postsurgical, 27-month follow-up was recommended.  Bilateral diagnostic mammogram in June 2022  revealed stable probably benign calcifications in the area of left lumpectomy site.  Bilateral diagnostic mammogram in July 2023 revealed stable postlumpectomy changes of the left breast.  Oncology History  Malignant neoplasm of upper-outer quadrant of left female breast (HCC)  05/08/2018 Initial Diagnosis   Malignant neoplasm of upper-outer quadrant of left female breast (HCC)   05/08/2018 Cancer Staging   Staging form: Breast, AJCC 8th Edition - Clinical stage from 05/08/2018: Stage 0 (cTis (DCIS), cN0, cM0, G2, ER+, PR-, HER2-) - Signed by Dellia Beckwith, MD on 01/04/2020   05/08/2018 Cancer Staging   Staging form: Breast, AJCC 8th Edition - Pathologic stage from 05/08/2018: Stage IA (pT1a, pN0(sn), cM0, G2, ER+, PR-, HER2-) - Signed by Dellia Beckwith, MD on 01/04/2020 Prognostic indicators: occ foci of invasive ca, up to 2mm       INTERVAL HISTORY:  Paula Graves  is here today for repeat clinical assessment. She is generally doing well. She states she continues tamoxifen daily without difficulty.  She denies any changes  in her breasts.  She reports a red, burning rash of the mid chest and bilateral inframammary x week.  This has not improved with steroid cream previously prescribed by dermatology.  She is scheduled with dermatology tomorrow.  She denies fevers or chills. She denies pain. Her appetite is good. Her weight has increased 2 pounds over last year . She states she had bone density scan done this month at Dr. Waverly Ferrari office, but she does not know the results.  She states she fell in September and hit left temporal area a brick.  She states she has continued head pain, but CT head was normal except for increasing atrophy.  She is scheduled to see a neurologist in April.  REVIEW OF SYSTEMS:  Review of Systems  Constitutional:  Negative for appetite change, chills, fatigue, fever and unexpected weight change.  HENT:   Negative for lump/mass, mouth sores and sore throat.   Respiratory:  Negative for cough and shortness of breath.   Cardiovascular:  Negative for chest pain and leg swelling.  Gastrointestinal:  Positive for constipation (IBS) and diarrhea (IBS). Negative for abdominal pain, blood in stool, nausea and vomiting.  Endocrine: Negative for hot flashes.  Genitourinary:  Negative for difficulty urinating, dysuria, frequency, hematuria, vaginal bleeding and vaginal discharge.   Musculoskeletal:  Positive for neck stiffness (previous surgery). Negative for arthralgias, back pain, gait problem, myalgias and neck pain.  Skin:  Negative for rash.  Neurological:  Positive for headaches (intermittent since fall). Negative for dizziness, extremity weakness, gait problem, light-headedness and numbness.  Hematological:  Negative for adenopathy. Does not bruise/bleed easily.  Psychiatric/Behavioral:  Negative for depression and sleep disturbance. The patient is not nervous/anxious.      VITALS:  Blood pressure 109/72, pulse 83, temperature 98.3 F (36.8 C), temperature source Oral, resp. rate 20, height 5'  2" (1.575 m), weight 140 lb 4.8 oz (63.6 kg), SpO2 100%.  Wt Readings from Last 3 Encounters:  03/26/23 140 lb 4.8 oz (63.6 kg)  03/10/22 139 lb 4.8 oz (63.2 kg)  03/11/21 142 lb 3.2 oz (64.5 kg)    Body mass index is 25.66 kg/m.  Performance status (ECOG): 0 - Asymptomatic  PHYSICAL EXAM:  Physical Exam Vitals and nursing note reviewed.  Constitutional:      General: She is not in acute distress.    Appearance: Normal appearance. She is normal weight. She is not ill-appearing.  HENT:     Head: Normocephalic and atraumatic.     Mouth/Throat:     Mouth: Mucous membranes are moist.     Pharynx: Oropharynx is clear. No oropharyngeal exudate or posterior oropharyngeal erythema.  Eyes:     General: No scleral icterus.    Extraocular Movements: Extraocular movements intact.     Conjunctiva/sclera: Conjunctivae normal.     Pupils: Pupils are equal, round, and reactive to light.  Cardiovascular:     Rate and Rhythm: Normal rate and regular rhythm.     Heart sounds: Normal heart sounds. No murmur heard.    No friction rub. No gallop.  Pulmonary:     Effort: Pulmonary effort is normal.     Breath sounds: Normal breath sounds. No wheezing, rhonchi or rales.  Chest:  Breasts:    Right: Normal. No swelling, bleeding, inverted nipple, mass, nipple discharge, skin change or tenderness.  Left: Normal. No swelling, bleeding, inverted nipple, mass, nipple discharge, skin change or tenderness.     Comments: Stable lumpectomy changes in the left breast Abdominal:     General: There is no distension.     Palpations: Abdomen is soft. There is no hepatomegaly, splenomegaly or mass.     Tenderness: There is no abdominal tenderness.  Musculoskeletal:        General: Normal range of motion.     Cervical back: Normal range of motion and neck supple. No tenderness.     Right lower leg: No edema.     Left lower leg: No edema.  Lymphadenopathy:     Cervical: No cervical adenopathy.     Upper  Body:     Right upper body: No supraclavicular or axillary adenopathy.     Left upper body: No supraclavicular or axillary adenopathy.     Lower Body: No right inguinal adenopathy. No left inguinal adenopathy.  Skin:    General: Skin is warm and dry.     Coloration: Skin is not jaundiced.     Findings: No rash.  Neurological:     Mental Status: She is alert and oriented to person, place, and time.     Cranial Nerves: No cranial nerve deficit.  Psychiatric:        Mood and Affect: Mood normal.        Behavior: Behavior normal.        Thought Content: Thought content normal.     LABS:      Latest Ref Rng & Units 12/18/2016   11:42 AM 12/24/2007    1:49 PM 12/14/2006   11:53 AM  CBC  WBC 4.0 - 10.5 K/uL 8.0  10.6  8.7   Hemoglobin 12.0 - 15.0 g/dL 16.1  09.6  04.5   Hematocrit 36.0 - 46.0 % 36.2  42.3  41.8   Platelets 150 - 400 K/uL 335  270  297       Latest Ref Rng & Units 12/18/2016   11:42 AM  CMP  Glucose 65 - 99 mg/dL 409   BUN 6 - 20 mg/dL 15   Creatinine 8.11 - 1.00 mg/dL 9.14   Sodium 782 - 956 mmol/L 139   Potassium 3.5 - 5.1 mmol/L 4.1   Chloride 101 - 111 mmol/L 103   CO2 22 - 32 mmol/L 27   Calcium 8.9 - 10.3 mg/dL 9.8      STUDIES:     Exam(s): [id number] CT/CT HEAD W/O CM  PROCEDURE:  CT HEAD W/O CM  REASON FOR EXAM:  R51.9  CT IMAGES OF THE BRAIN WERE ACQUIRED WITHOUT INTRAVENOUS CONTRAST. MULTIPLANAR REFORMATS WERE ACQUIRED.  COMPARISON: NONE AVAILABLE.  __________________  FINDINGS:  * ACUTE: NO ACUTE INFARCT OR HEMORRHAGE. NO MASS EFFECT OR HERNIATION.  * BRAIN PARENCHYMA: SIGNAL INTENSITIES ARE WITHIN NORMAL LIMITS FOR AGE WITH MILD PERIVENTRICULAR HYPOATTENUATION CONSISTENT WITH CHRONIC MICROVASCULAR DISEASE..  * VENTRICLES/EXTRA-AXIAL SPACES: MILD FRONTOTEMPORAL VOLUME LOSS WITH PROMINENCE OF THE CSF CONTAINING SPACES. NO HYDROCEPHALUS OR EXTRA-AXIAL FLUID COLLECTIONS.  * EXTRACRANIAL STRUCTURES: VISUALIZED OSSEOUS STRUCTURES ARE NORMAL.  SOFT TISSUES ARE NORMAL.  __________________  IMPRESSION:  NO ACUTE INTRACRANIAL FINDINGS.       Exam(s): [id number] MAM/MAM DIGITAL TOMO SCREENING B  CLINICAL DATA: Screening.  EXAM:  DIGITAL SCREENING BILATERAL MAMMOGRAM WITH TOMOSYNTHESIS AND CAD  TECHNIQUE:  Bilateral screening digital craniocaudal and mediolateral oblique  mammograms were obtained. Bilateral screening digital breast  tomosynthesis was performed. The images were evaluated with  computer-aided detection.  COMPARISON: Previous exam(s).  ACR Breast Density Category b: There are scattered areas of  fibroglandular density.  FINDINGS:  There are no findings suspicious for malignancy.  IMPRESSION:  No mammographic evidence of malignancy. A result letter of this  screening mammogram will be mailed directly to the patient.  RECOMMENDATION:  Screening mammogram in one year. (Code:SM-B-01Y)  BI-RADS CATEGORY 1: Negative.    HISTORY:   Past Medical History:  Diagnosis Date   Anxiety    Arthritis    Chronic kidney disease    elevated creatinine  10/18   having renal studies- renal sonagram 12/15/16 dr patel  [facility] req   Complication of anesthesia    had panic attack prior to first neck surgery   Diabetes mellitus without complication (HCC)    type II  d/c'd metformion 10/18 due to elevated creatinine-having renal studies   Hypertension    dr d/c'd bp meds in 10/18 due to creatinine elevated- sonagram renal 12/15/16   Pneumonia    hx    Past Surgical History:  Procedure Laterality Date   ABDOMINAL HYSTERECTOMY     ANTERIOR CERVICAL DECOMP/DISCECTOMY FUSION N/A 12/20/2016   Procedure: PARTIAL REMOVAL TETHER CERVICAL PLATE, ANTERIOR CERVICAL DECOMPRESSION/DISCECTOMY FUSION  CERVICAL TWO- CERVICAL THREE, CERVICAL THREE-CERVICAL FOUR ;  Surgeon: Shirlean Kelly, MD;  Location: [facility] OR;  Service: Neurosurgery;  Laterality: N/A;   bladder tack     CARPAL TUNNEL RELEASE Left 2006   CERVICAL DISC SURGERY      08,09   CHOLECYSTECTOMY  80's   COLONOSCOPY  04/22/2009   Colonic polyp, status post polypectomy.    ESOPHAGOGASTRODUODENOSCOPY  04/22/2009   Moderate gastritis. Otherwise normal EGD.    TONSILLECTOMY     child   TUBAL LIGATION      Family History  Problem Relation Age of Onset   Heart disease Mother    Diabetes Maternal Grandmother    Stomach cancer Paternal Uncle     Social History:  reports that she has never smoked. She has never used smokeless tobacco. She reports that she does not drink alcohol and does not use drugs.The patient is alone today.  Allergies:  Allergies  Allergen Reactions   Codeine Nausea And Vomiting and Other (See Comments)    Stomach pain/ cramps Other reaction(s): Cramps (ALLERGY/intolerance), Other (See Comments) Stomach pain Stomach pain/ cramps   Cephalexin Nausea And Vomiting and Other (See Comments)    dizziness   Morphine Nausea And Vomiting   Morphine And Codeine Nausea And Vomiting    Current Medications: Current Outpatient Medications  Medication Sig Dispense Refill   SUMAtriptan (IMITREX) 100 MG tablet Take 100 mg by mouth as directed.     amitriptyline (ELAVIL) 75 MG tablet Take 75 mg by mouth at bedtime.     amLODipine (NORVASC) 10 MG tablet Take 10 mg by mouth daily.     Ascorbic Acid (VITAMIN C) 1000 MG tablet Take 1,000 mg daily by mouth.     cyclobenzaprine (FLEXERIL) 10 MG tablet Take 5-10 mg 3 (three) times daily as needed by mouth for muscle spasms (5-10 depends on pain).     esomeprazole (NEXIUM) 20 MG capsule Take 20 mg by mouth daily.     furosemide (LASIX) 20 MG tablet Take 20 mg by mouth.     LORazepam (ATIVAN) 1 MG tablet Take 1 mg 2 (two) times daily as needed by mouth for anxiety. (Patient not taking: Reported on 03/11/2021)     losartan (COZAAR) 25  MG tablet Take 25 mg by mouth daily.     ondansetron (ZOFRAN) 4 MG tablet Take by mouth. (Patient not taking: Reported on 03/26/2023)     simvastatin (ZOCOR) 40 MG tablet  Take 40 mg daily at 6 PM by mouth.     tamoxifen (NOLVADEX) 20 MG tablet TAKE 1 TABLET(20 MG) BY MOUTH DAILY 90 tablet 2   venlafaxine (EFFEXOR) 75 MG tablet Take 75 mg 3 (three) times daily with meals by mouth.     No current facility-administered medications for this visit.     ASSESSMENT & PLAN:   Assessment & Plan: Paula Graves is a 73 y.o. with a history of stage IA (T1 N0 M0) hormone receptor positive left breast diagnosed in March 2020. She was treated with lumpectomy in April 2020.  She received adjuvant radiation to the left breast completed in June.  She was placed on tamoxifen in July 2020.  Bilateral screening mammogram in July 2024 did not reveal any evidence of malignancy.  She remains without evidence of recurrence.  She will complete 5 years of tamoxifen in July.  She continues to follow with Dr. Georgiana Shore and will see him with bilateral screening mammogram in July.  I will plan to see her back in 1 year in long-term survivorship clinic.    The patient understands the plans discussed today and is in agreement with them.  She knows to contact our office if she develops concerns prior to her next appointment.   I provided 15 minutes of face-to-face time during this encounter and > 50% was spent counseling as documented under my assessment and plan.    Adah Perl, PA-C  [facility] [place] [facility] [address] [place] [address] Dept: [phone] Dept Fax: [phone]   No orders of the defined types were placed in this encounter.

## 2023-03-27 DIAGNOSIS — L820 Inflamed seborrheic keratosis: Secondary | ICD-10-CM | POA: Diagnosis not present

## 2023-03-27 DIAGNOSIS — L304 Erythema intertrigo: Secondary | ICD-10-CM | POA: Diagnosis not present

## 2023-04-24 DIAGNOSIS — Z6824 Body mass index (BMI) 24.0-24.9, adult: Secondary | ICD-10-CM | POA: Diagnosis not present

## 2023-04-24 DIAGNOSIS — F410 Panic disorder [episodic paroxysmal anxiety] without agoraphobia: Secondary | ICD-10-CM | POA: Diagnosis not present

## 2023-04-24 DIAGNOSIS — R0789 Other chest pain: Secondary | ICD-10-CM | POA: Diagnosis not present

## 2023-05-09 ENCOUNTER — Ambulatory Visit: Payer: Medicare Other | Admitting: Neurology

## 2023-06-20 DIAGNOSIS — N183 Chronic kidney disease, stage 3 unspecified: Secondary | ICD-10-CM | POA: Diagnosis not present

## 2023-06-20 DIAGNOSIS — N390 Urinary tract infection, site not specified: Secondary | ICD-10-CM | POA: Diagnosis not present

## 2023-06-20 DIAGNOSIS — N189 Chronic kidney disease, unspecified: Secondary | ICD-10-CM | POA: Diagnosis not present

## 2023-06-27 DIAGNOSIS — B9620 Unspecified Escherichia coli [E. coli] as the cause of diseases classified elsewhere: Secondary | ICD-10-CM | POA: Diagnosis not present

## 2023-06-27 DIAGNOSIS — N2581 Secondary hyperparathyroidism of renal origin: Secondary | ICD-10-CM | POA: Diagnosis not present

## 2023-06-27 DIAGNOSIS — D631 Anemia in chronic kidney disease: Secondary | ICD-10-CM | POA: Diagnosis not present

## 2023-06-27 DIAGNOSIS — I129 Hypertensive chronic kidney disease with stage 1 through stage 4 chronic kidney disease, or unspecified chronic kidney disease: Secondary | ICD-10-CM | POA: Diagnosis not present

## 2023-06-27 DIAGNOSIS — N390 Urinary tract infection, site not specified: Secondary | ICD-10-CM | POA: Diagnosis not present

## 2023-06-27 DIAGNOSIS — N1832 Chronic kidney disease, stage 3b: Secondary | ICD-10-CM | POA: Diagnosis not present

## 2023-07-11 ENCOUNTER — Encounter: Payer: Self-pay | Admitting: Gastroenterology

## 2023-07-11 ENCOUNTER — Ambulatory Visit (INDEPENDENT_AMBULATORY_CARE_PROVIDER_SITE_OTHER): Admitting: Gastroenterology

## 2023-07-11 VITALS — BP 100/60 | HR 100 | Ht 60.25 in | Wt 137.2 lb

## 2023-07-11 DIAGNOSIS — R1013 Epigastric pain: Secondary | ICD-10-CM | POA: Diagnosis not present

## 2023-07-11 DIAGNOSIS — K2100 Gastro-esophageal reflux disease with esophagitis, without bleeding: Secondary | ICD-10-CM

## 2023-07-11 DIAGNOSIS — N189 Chronic kidney disease, unspecified: Secondary | ICD-10-CM

## 2023-07-11 DIAGNOSIS — K581 Irritable bowel syndrome with constipation: Secondary | ICD-10-CM | POA: Diagnosis not present

## 2023-07-11 DIAGNOSIS — R1310 Dysphagia, unspecified: Secondary | ICD-10-CM | POA: Diagnosis not present

## 2023-07-11 DIAGNOSIS — D631 Anemia in chronic kidney disease: Secondary | ICD-10-CM | POA: Diagnosis not present

## 2023-07-11 DIAGNOSIS — N183 Chronic kidney disease, stage 3 unspecified: Secondary | ICD-10-CM

## 2023-07-11 DIAGNOSIS — D649 Anemia, unspecified: Secondary | ICD-10-CM

## 2023-07-11 MED ORDER — ESOMEPRAZOLE MAGNESIUM 20 MG PO CPDR: 20.0000 mg | DELAYED_RELEASE_CAPSULE | Freq: Two times a day (BID) | ORAL | 3 refills | Status: DC

## 2023-07-11 NOTE — Progress Notes (Signed)
 Chief Complaint: Dysphagia  Referring Provider:  Dr Geralyn Knee      ASSESSMENT AND PLAN;     #1. GERD with EE, occ dysphagia. #2. IBS with alt diarrhea and constipation  #3. Epigastric Pain. S/P cholecystectomy in past #4. Anemia associated with CKD3 (followed by Dr. Lydia Sams)   Plan:  -EGD with +/- dil. -Increased Nexium 20mg  po BID #180, 2RF -CBC, CMP, lipase at the time of EGD. - If continued problems, consider CT Abdo/pelvis (If Cr is OK)   HPI:    Paula Graves is a 73 y.o. female  History of Present Illness Paula Graves is a 73 year old female with gastroesophageal reflux disease who presents with gastrointestinal symptoms including reflux and constipation.  She experiences significant reflux symptoms, particularly if she does not eat before 4 PM. She currently takes Nexium 20 mg once daily and supplements with Tums as needed, especially when experiencing nausea or acid sensation. Some pills, particularly vitamin C , are difficult to swallow, necessitating a switch to gummies. No significant dysphagia to food is reported.  She would occasionally have postprandial epigastric pain.  She has previous history of cholecystectomy.  She has a history of constipation and diarrhea, with bowel movements occurring every three to four days followed by a large bowel movement. She takes probiotics daily to aid regularity. Her medication regimen includes amitriptyline 75 mg at bedtime, amlodipine, Lasix, Ativan , Cozaar, Zofran , Zocor , Imitrex  as needed, Effexor , and tamoxifen . She also takes over-the-counter Glucosil for diabetes management.  She has a history of breast cancer diagnosed in 2020, for which she underwent radiation therapy and three surgeries due to complications including a seroma. She is nearing the end of a five-year course of tamoxifen  and plans to discontinue it at the end of the month.  She had several abdominal surgeries including cholecystectomy,  unilateral oophorectomy 2002, bladder tack, TAH 1994 due to endometriosis.   Past GI WU:  EGD 11/2017 - LA Grade B reflux esophagitis. Biopsied. - Gastritis. Biopsied. - Bx: Reflux.  Negative gastric biopsies for H. pylori.  Colon 11/2017 - Minimal sigmoid diverticulosis. - Non- bleeding internal hemorrhoids. - Otherwise normal colonoscopy to TI. No need to repeat unless red flag symptoms - Neg random colon biopsies for microscopic colitis  Colon neg 12/2012 Dr Randal Bury but poor preparation, 1 cm polyp on colonoscopy 04/2009, negative random colonic biopsies for microscopic colitis.  EGD 04/2009 showing moderate gastritis with negative small bowel biopsies     Past Medical History:  Diagnosis Date   Anxiety    Arthritis    Chronic kidney disease    elevated creatinine  10/18   having renal studies- renal sonagram 12/15/16 dr patel  [facility] req   Complication of anesthesia    had panic attack prior to first neck surgery   Diabetes mellitus without complication (HCC)    type II  d/c'd metformion 10/18 due to elevated creatinine-having renal studies   Hypertension    dr d/c'd bp meds in 10/18 due to creatinine elevated- sonagram renal 12/15/16   Pneumonia    hx    Past Surgical History:  Procedure Laterality Date   ABDOMINAL HYSTERECTOMY     ANTERIOR CERVICAL DECOMP/DISCECTOMY FUSION N/A 12/20/2016   Procedure: PARTIAL REMOVAL TETHER CERVICAL PLATE, ANTERIOR CERVICAL DECOMPRESSION/DISCECTOMY FUSION  CERVICAL TWO- CERVICAL THREE, CERVICAL THREE-CERVICAL FOUR ;  Surgeon: Yvonna Herder, MD;  Location: [facility] OR;  Service: Neurosurgery;  Laterality: N/A;   bladder tack  CARPAL TUNNEL RELEASE Left 2006   CERVICAL DISC SURGERY     08,09   CHOLECYSTECTOMY  80's   COLONOSCOPY  04/22/2009   Colonic polyp, status post polypectomy.    ESOPHAGOGASTRODUODENOSCOPY  04/22/2009   Moderate gastritis. Otherwise normal EGD.    TONSILLECTOMY     child   TUBAL LIGATION      Family  History  Problem Relation Age of Onset   Heart disease Mother    Diabetes Maternal Grandmother    Stomach cancer Paternal Uncle     Social History   Tobacco Use   Smoking status: Never   Smokeless tobacco: Never  Vaping Use   Vaping status: Never Used  Substance Use Topics   Alcohol use: No   Drug use: No    Current Outpatient Medications  Medication Sig Dispense Refill   amitriptyline (ELAVIL) 75 MG tablet Take 75 mg by mouth at bedtime.     amLODipine (NORVASC) 10 MG tablet Take 10 mg by mouth daily.     Ascorbic Acid  (VITAMIN C ) 1000 MG tablet Take 1,000 mg daily by mouth.     cyclobenzaprine  (FLEXERIL ) 10 MG tablet Take 5-10 mg 3 (three) times daily as needed by mouth for muscle spasms (5-10 depends on pain).     esomeprazole (NEXIUM) 20 MG capsule Take 20 mg by mouth daily.     furosemide (LASIX) 20 MG tablet Take 20 mg by mouth.     LORazepam  (ATIVAN ) 1 MG tablet Take 1 mg by mouth 2 (two) times daily as needed for anxiety.     losartan (COZAAR) 25 MG tablet Take 25 mg by mouth daily.     ondansetron  (ZOFRAN ) 4 MG tablet Take by mouth.     simvastatin  (ZOCOR ) 40 MG tablet Take 40 mg daily at 6 PM by mouth.     SUMAtriptan  (IMITREX ) 100 MG tablet Take 100 mg by mouth as directed.     tamoxifen  (NOLVADEX ) 20 MG tablet TAKE 1 TABLET(20 MG) BY MOUTH DAILY 90 tablet 2   venlafaxine  (EFFEXOR ) 75 MG tablet Take 75 mg 3 (three) times daily with meals by mouth.     No current facility-administered medications for this visit.    Allergies  Allergen Reactions   Codeine Nausea And Vomiting and Other (See Comments)    Stomach pain/ cramps Other reaction(s): Cramps (ALLERGY/intolerance), Other (See Comments) Stomach pain Stomach pain/ cramps   Keflex [Cephalexin] Nausea And Vomiting and Other (See Comments)    dizziness   Morphine  Nausea And Vomiting   Morphine  And Codeine Nausea And Vomiting    Review of Systems:  Constitutional: Denies fever, chills, diaphoresis,  appetite change and fatigue.  HEENT: Denies photophobia, eye pain, redness, hearing loss, ear pain, congestion, sore throat, rhinorrhea, sneezing, mouth sores, neck pain, neck stiffness and tinnitus.   Respiratory: Denies SOB, DOE, cough, chest tightness,  and wheezing.   Cardiovascular: Denies chest pain, palpitations and leg swelling.  Genitourinary: Denies dysuria, urgency, frequency, hematuria, flank pain and difficulty urinating.  Musculoskeletal: Denies myalgias, joint swelling, arthralgias and gait problem. Has back pain.  Neck surgery 2007, 2008 and 2018 Skin: No rash.  Neurological: Denies dizziness, seizures, syncope, weakness, light-headedness, numbness and headaches.  Hematological: Denies adenopathy. Easy bruising, personal or family bleeding history  Psychiatric/Behavioral: Has anxiety or depression     Physical Exam:    BP 100/60 (BP Location: Left Arm, Patient Position: Sitting, Cuff Size: Normal)   Pulse 100   Ht 5' 0.25" (1.53 m) Comment:  height measured without shoes  Wt 137 lb 4 oz (62.3 kg)   BMI 26.58 kg/m  Constitutional:  Well-developed, in no acute distress. Psychiatric: Normal mood and affect. Behavior is normal. HEENT: Pupils normal.  Conjunctivae are normal. No scleral icterus. Cardiovascular: Normal rate, regular rhythm. No edema Pulmonary/chest: Effort normal and breath sounds normal. No wheezing, rales or rhonchi. Abdominal: Soft, nondistended.  Mild epigastric tenderness, well-healed surgical scars. Bowel sounds active throughout. There are no masses palpable. No hepatomegaly. Rectal:  defered Neurological: Alert and oriented to person place and time. Skin: Skin is warm and dry. No rashes noted.  Data Reviewed: I have personally reviewed following labs and imaging studies  CBC:    Latest Ref Rng & Units 12/18/2016   11:42 AM 12/24/2007    1:49 PM 12/14/2006   11:53 AM  CBC  WBC 4.0 - 10.5 K/uL 8.0  10.6  8.7   Hemoglobin 12.0 - 15.0 g/dL 16.1   09.6  04.5   Hematocrit 36.0 - 46.0 % 36.2  42.3  41.8   Platelets 150 - 400 K/uL 335  270  297     CMP:    Latest Ref Rng & Units 12/18/2016   11:42 AM  CMP  Glucose 65 - 99 mg/dL 409   BUN 6 - 20 mg/dL 15   Creatinine 8.11 - 1.00 mg/dL 9.14   Sodium 782 - 956 mmol/L 139   Potassium 3.5 - 5.1 mmol/L 4.1   Chloride 101 - 111 mmol/L 103   CO2 22 - 32 mmol/L 27   Calcium 8.9 - 10.3 mg/dL 9.8      Magnus Schuller, MD 07/11/2023, 3:11 PM  Cc: Dr Geralyn Knee

## 2023-07-11 NOTE — Patient Instructions (Addendum)
 _______________________________________________________  If your blood pressure at your visit was 140/90 or greater, please contact your primary care physician to follow up on this.  _______________________________________________________  If you are age 74 or older, your body mass index should be between 23-30. Your Body mass index is 26.58 kg/m. If this is out of the aforementioned range listed, please consider follow up with your Primary Care Provider.  If you are age 7 or younger, your body mass index should be between 19-25. Your Body mass index is 26.58 kg/m. If this is out of the aformentioned range listed, please consider follow up with your Primary Care Provider.   ________________________________________________________  The  GI providers would like to encourage you to use MYCHART to communicate with providers for non-urgent requests or questions.  Due to long hold times on the telephone, sending your provider a message by [facility] may be a faster and more efficient way to get a response.  Please allow 48 business hours for a response.  Please remember that this is for non-urgent requests.  _______________________________________________________  We have sent the following medications to your pharmacy for you to pick up at your convenience: Nexium  You have been scheduled for an endoscopy. Please follow written instructions given to you at your visit today.  If you use inhalers (even only as needed), please bring them with you on the day of your procedure.  If you take any of the following medications, they will need to be adjusted prior to your procedure:   DO NOT TAKE 7 DAYS PRIOR TO TEST- Trulicity (dulaglutide) Ozempic, Wegovy (semaglutide) Mounjaro (tirzepatide) Bydureon Bcise (exanatide extended release)  DO NOT TAKE 1 DAY PRIOR TO YOUR TEST Rybelsus (semaglutide) Adlyxin (lixisenatide) Victoza (liraglutide) Byetta  (exanatide) ___________________________________________________________________________  Thank you,  Dr. Lajuan Pila

## 2023-07-19 DIAGNOSIS — D485 Neoplasm of uncertain behavior of skin: Secondary | ICD-10-CM | POA: Diagnosis not present

## 2023-07-19 DIAGNOSIS — L821 Other seborrheic keratosis: Secondary | ICD-10-CM | POA: Diagnosis not present

## 2023-07-19 DIAGNOSIS — L820 Inflamed seborrheic keratosis: Secondary | ICD-10-CM | POA: Diagnosis not present

## 2023-07-19 DIAGNOSIS — D225 Melanocytic nevi of trunk: Secondary | ICD-10-CM | POA: Diagnosis not present

## 2023-08-15 ENCOUNTER — Ambulatory Visit: Admitting: Gastroenterology

## 2023-08-15 ENCOUNTER — Encounter: Payer: Self-pay | Admitting: Gastroenterology

## 2023-08-15 ENCOUNTER — Other Ambulatory Visit (INDEPENDENT_AMBULATORY_CARE_PROVIDER_SITE_OTHER)

## 2023-08-15 VITALS — BP 133/61 | HR 71 | Temp 98.7°F | Resp 13 | Ht 60.0 in | Wt 137.0 lb

## 2023-08-15 DIAGNOSIS — E119 Type 2 diabetes mellitus without complications: Secondary | ICD-10-CM | POA: Diagnosis not present

## 2023-08-15 DIAGNOSIS — R1013 Epigastric pain: Secondary | ICD-10-CM

## 2023-08-15 DIAGNOSIS — F419 Anxiety disorder, unspecified: Secondary | ICD-10-CM | POA: Diagnosis not present

## 2023-08-15 DIAGNOSIS — K319 Disease of stomach and duodenum, unspecified: Secondary | ICD-10-CM | POA: Diagnosis not present

## 2023-08-15 DIAGNOSIS — K2289 Other specified disease of esophagus: Secondary | ICD-10-CM

## 2023-08-15 DIAGNOSIS — K2100 Gastro-esophageal reflux disease with esophagitis, without bleeding: Secondary | ICD-10-CM | POA: Diagnosis not present

## 2023-08-15 DIAGNOSIS — I10 Essential (primary) hypertension: Secondary | ICD-10-CM | POA: Diagnosis not present

## 2023-08-15 DIAGNOSIS — F32A Depression, unspecified: Secondary | ICD-10-CM | POA: Diagnosis not present

## 2023-08-15 LAB — COMPREHENSIVE METABOLIC PANEL WITH GFR
ALT: 14 U/L (ref 0–35)
AST: 18 U/L (ref 0–37)
Albumin: 4.4 g/dL (ref 3.5–5.2)
Alkaline Phosphatase: 65 U/L (ref 39–117)
BUN: 16 mg/dL (ref 6–23)
CO2: 32 meq/L (ref 19–32)
Calcium: 9 mg/dL (ref 8.4–10.5)
Chloride: 100 meq/L (ref 96–112)
Creatinine, Ser: 1.39 mg/dL — ABNORMAL HIGH (ref 0.40–1.20)
GFR: 37.41 mL/min — ABNORMAL LOW (ref >60.00)
Glucose, Bld: 97 mg/dL (ref 70–99)
Potassium: 3.5 meq/L (ref 3.5–5.1)
Sodium: 140 meq/L (ref 135–145)
Total Bilirubin: 0.5 mg/dL (ref 0.2–1.2)
Total Protein: 6.7 g/dL (ref 6.0–8.3)

## 2023-08-15 LAB — CBC WITH DIFFERENTIAL/PLATELET
Basophils Absolute: 0.1 K/uL (ref 0.0–0.1)
Basophils Relative: 0.7 % (ref 0.0–3.0)
Eosinophils Absolute: 0.2 K/uL (ref 0.0–0.7)
Eosinophils Relative: 2.1 % (ref 0.0–5.0)
HCT: 35.8 % — ABNORMAL LOW (ref 36.0–46.0)
Hemoglobin: 12.1 g/dL (ref 12.0–15.0)
Lymphocytes Relative: 25.1 % (ref 12.0–46.0)
Lymphs Abs: 1.8 K/uL (ref 0.7–4.0)
MCHC: 33.7 g/dL (ref 30.0–36.0)
MCV: 91.9 fl (ref 78.0–100.0)
Monocytes Absolute: 0.6 K/uL (ref 0.1–1.0)
Monocytes Relative: 8.6 % (ref 3.0–12.0)
Neutro Abs: 4.6 K/uL (ref 1.4–7.7)
Neutrophils Relative %: 63.5 % (ref 43.0–77.0)
Platelets: 255 K/uL (ref 150.0–400.0)
RBC: 3.9 Mil/uL (ref 3.87–5.11)
RDW: 12.6 % (ref 11.5–15.5)
WBC: 7.2 K/uL (ref 4.0–10.5)

## 2023-08-15 LAB — LIPASE: Lipase: 20 U/L (ref 11.0–59.0)

## 2023-08-15 MED ORDER — ESOMEPRAZOLE MAGNESIUM 20 MG PO CPDR: 20.0000 mg | DELAYED_RELEASE_CAPSULE | Freq: Two times a day (BID) | ORAL | 3 refills | Status: AC

## 2023-08-15 MED ORDER — ESOMEPRAZOLE MAGNESIUM 20 MG PO CPDR: 20.0000 mg | DELAYED_RELEASE_CAPSULE | Freq: Every day | ORAL | 3 refills | Status: DC

## 2023-08-15 MED ORDER — SODIUM CHLORIDE 0.9 % IV SOLN: 500.0000 mL | Freq: Once | INTRAVENOUS | Status: DC

## 2023-08-15 NOTE — Op Note (Signed)
 [facility] Patient Name: Paula Graves Procedure Date: 08/15/2023 2:39 PM MRN: [id number] Endoscopist: Lynnie Bring , MD, [id number] Age: 73 Referring MD:  Date of Birth: Jun 25, 1950 Gender: Female Account #: [id number] Procedure:                Upper GI endoscopy Indications:              Dysphagia. EpiPen  carcinoma Medicines:                Monitored Anesthesia Care Procedure:                Pre-Anesthesia Assessment:                           - Prior to the procedure, a History and Physical                            was performed, and patient medications and                            allergies were reviewed. The patient's tolerance of                            previous anesthesia was also reviewed. The risks                            and benefits of the procedure and the sedation                            options and risks were discussed with the patient.                            All questions were answered, and informed consent                            was obtained. Prior Anticoagulants: The patient has                            taken no anticoagulant or antiplatelet agents. ASA                            Grade Assessment: II - A patient with mild systemic                            disease. After reviewing the risks and benefits,                            the patient was deemed in satisfactory condition to                            undergo the procedure.                           After obtaining informed consent, the endoscope was  passed under direct vision. Throughout the                            procedure, the patient's blood pressure, pulse, and                            oxygen saturations were monitored continuously. The                            Olympus Scope [id number] was introduced through the                            mouth, and advanced to the second part of duodenum.                            The upper GI endoscopy was  accomplished without                            difficulty. The patient tolerated the procedure                            well. Scope In: Scope Out: 2:53:37 PM Findings:                 The examined esophagus was moderately tortuous but                            normal. Healed distal esophageal erosions at GE                            junction 36 cm from the incisors. No strictures.                            The scope was withdrawn. Dilation was performed                            with a Maloney dilator with mild resistance at 50                            Fr and 52 Fr.                           Localized mild inflammation characterized by                            erythema was found in the gastric antrum. Biopsies                            were taken with a cold forceps for histology.                           The examined duodenum was normal. Complications:            No immediate complications. Estimated Blood Loss:     Estimated blood loss: none. Impression:               -  Presbyesophagus s/p esophageal dilatation.                           - Gastritis. Biopsied.                           - Normal examined duodenum. Recommendation:           - Patient has a contact number available for                            emergencies. The signs and symptoms of potential                            delayed complications were discussed with the                            patient. Return to normal activities tomorrow.                            Written discharge instructions were provided to the                            patient.                           - Postdilatation diet.                           - Continue present medications including Nexium  20                            mg p.o. twice daily x 4 weeks, then once a day                           - Await pathology results.                           - Avoid nonsteroidals.                           - Check CBC, CMP, lipase today.                            - The findings and recommendations were discussed                            with the patient's family. Lynnie Bring, MD 08/15/2023 2:58:59 PM This report has been signed electronically.

## 2023-08-15 NOTE — Progress Notes (Signed)
 Chief Complaint: Dysphagia  Referring Provider:  Dr Glendia      ASSESSMENT AND PLAN;     #1. GERD with EE, occ dysphagia. #2. IBS with alt diarrhea and constipation  #3. Epigastric Pain. S/P cholecystectomy in past #4. Anemia associated with CKD3 (followed by Dr. Tobie)   Plan:  -EGD with +/- dil. -Increased Nexium  20mg  po BID #180, 2RF -CBC, CMP, lipase at the time of EGD. - If continued problems, consider CT Abdo/pelvis (If Cr is OK)   HPI:    Paula Graves is a 73 y.o. female  History of Present Illness Paula Graves is a 73 year old female with gastroesophageal reflux disease who presents with gastrointestinal symptoms including reflux and constipation.  She experiences significant reflux symptoms, particularly if she does not eat before 4 PM. She currently takes Nexium  20 mg once daily and supplements with Tums as needed, especially when experiencing nausea or acid sensation. Some pills, particularly vitamin C , are difficult to swallow, necessitating a switch to gummies. No significant dysphagia to food is reported.  She would occasionally have postprandial epigastric pain.  She has previous history of cholecystectomy.  She has a history of constipation and diarrhea, with bowel movements occurring every three to four days followed by a large bowel movement. She takes probiotics daily to aid regularity. Her medication regimen includes amitriptyline 75 mg at bedtime, amlodipine, Lasix, Ativan , Cozaar, Zofran , Zocor , Imitrex  as needed, Effexor , and tamoxifen . She also takes over-the-counter Glucosil for diabetes management.  She has a history of breast cancer diagnosed in 2020, for which she underwent radiation therapy and three surgeries due to complications including a seroma. She is nearing the end of a five-year course of tamoxifen  and plans to discontinue it at the end of the month.  She had several abdominal surgeries including cholecystectomy,  unilateral oophorectomy 2002, bladder tack, TAH 1994 due to endometriosis.   Past GI WU:  EGD 11/2017 - LA Grade B reflux esophagitis. Biopsied. - Gastritis. Biopsied. - Bx: Reflux.  Negative gastric biopsies for H. pylori.  Colon 11/2017 - Minimal sigmoid diverticulosis. - Non- bleeding internal hemorrhoids. - Otherwise normal colonoscopy to TI. No need to repeat unless red flag symptoms - Neg random colon biopsies for microscopic colitis  Colon neg 12/2012 Dr Towana but poor preparation, 1 cm polyp on colonoscopy 04/2009, negative random colonic biopsies for microscopic colitis.  EGD 04/2009 showing moderate gastritis with negative small bowel biopsies     Past Medical History:  Diagnosis Date   Anxiety    Arthritis    Cancer (HCC)    Cataract    Chronic kidney disease    elevated creatinine  10/18   having renal studies- renal sonagram 12/15/16 dr patel  [facility] req   Complication of anesthesia    had panic attack prior to first neck surgery   Depression    Diabetes mellitus without complication (HCC)    type II  d/c'd metformion 10/18 due to elevated creatinine-having renal studies   GERD (gastroesophageal reflux disease)    Hypertension    dr d/c'd bp meds in 10/18 due to creatinine elevated- sonagram renal 12/15/16   Pneumonia    hx    Past Surgical History:  Procedure Laterality Date   ABDOMINAL HYSTERECTOMY     ANTERIOR CERVICAL DECOMP/DISCECTOMY FUSION N/A 12/20/2016   Procedure: PARTIAL REMOVAL TETHER CERVICAL PLATE, ANTERIOR CERVICAL DECOMPRESSION/DISCECTOMY FUSION  CERVICAL TWO- CERVICAL THREE, CERVICAL THREE-CERVICAL FOUR ;  Surgeon: Alix,  Lamar, MD;  Location: [facility] OR;  Service: Neurosurgery;  Laterality: N/A;   bladder tack     CARPAL TUNNEL RELEASE Left 2006   CERVICAL DISC SURGERY     08,09   CHOLECYSTECTOMY  80's   COLONOSCOPY  04/22/2009   Colonic polyp, status post polypectomy.    ESOPHAGOGASTRODUODENOSCOPY  04/22/2009   Moderate  gastritis. Otherwise normal EGD.    TONSILLECTOMY     child   TUBAL LIGATION      Family History  Problem Relation Age of Onset   Heart disease Mother    Stomach cancer Paternal Uncle    Diabetes Maternal Grandmother    Colon cancer Neg Hx    Colon polyps Neg Hx    Esophageal cancer Neg Hx    Rectal cancer Neg Hx     Social History   Tobacco Use   Smoking status: Never   Smokeless tobacco: Never  Vaping Use   Vaping status: Never Used  Substance Use Topics   Alcohol use: No   Drug use: No    Current Outpatient Medications  Medication Sig Dispense Refill   amitriptyline (ELAVIL) 75 MG tablet Take 75 mg by mouth at bedtime.     amLODipine (NORVASC) 10 MG tablet Take 10 mg by mouth daily.     Ascorbic Acid  (VITAMIN C ) 1000 MG tablet Take 1,000 mg daily by mouth.     cyclobenzaprine  (FLEXERIL ) 10 MG tablet Take 5-10 mg 3 (three) times daily as needed by mouth for muscle spasms (5-10 depends on pain).     esomeprazole  (NEXIUM ) 20 MG capsule Take 1 capsule (20 mg total) by mouth 2 (two) times daily before a meal. 180 capsule 3   furosemide (LASIX) 20 MG tablet Take 20 mg by mouth.     LORazepam  (ATIVAN ) 1 MG tablet Take 1 mg by mouth 2 (two) times daily as needed for anxiety.     losartan (COZAAR) 25 MG tablet Take 25 mg by mouth daily.     simvastatin  (ZOCOR ) 40 MG tablet Take 40 mg daily at 6 PM by mouth.     venlafaxine  (EFFEXOR ) 75 MG tablet Take 75 mg 3 (three) times daily with meals by mouth.     ondansetron  (ZOFRAN ) 4 MG tablet Take by mouth.     SUMAtriptan  (IMITREX ) 100 MG tablet Take 100 mg by mouth as directed.     tamoxifen  (NOLVADEX ) 20 MG tablet TAKE 1 TABLET(20 MG) BY MOUTH DAILY 90 tablet 2   Current Facility-Administered Medications  Medication Dose Route Frequency Provider Last Rate Last Admin   0.9 %  sodium chloride  infusion  500 mL Intravenous Once Charlanne Groom, MD        Allergies  Allergen Reactions   Codeine Nausea And Vomiting and Other (See  Comments)    Stomach pain/ cramps Other reaction(s): Cramps (ALLERGY/intolerance), Other (See Comments) Stomach pain Stomach pain/ cramps   Keflex [Cephalexin] Nausea And Vomiting and Other (See Comments)    dizziness   Morphine  Nausea And Vomiting   Morphine  And Codeine Nausea And Vomiting    Review of Systems:  Constitutional: Denies fever, chills, diaphoresis, appetite change and fatigue.  HEENT: Denies photophobia, eye pain, redness, hearing loss, ear pain, congestion, sore throat, rhinorrhea, sneezing, mouth sores, neck pain, neck stiffness and tinnitus.   Respiratory: Denies SOB, DOE, cough, chest tightness,  and wheezing.   Cardiovascular: Denies chest pain, palpitations and leg swelling.  Genitourinary: Denies dysuria, urgency, frequency, hematuria, flank pain and difficulty urinating.  Musculoskeletal: Denies myalgias, joint swelling, arthralgias and gait problem. Has back pain.  Neck surgery 2007, 2008 and 2018 Skin: No rash.  Neurological: Denies dizziness, seizures, syncope, weakness, light-headedness, numbness and headaches.  Hematological: Denies adenopathy. Easy bruising, personal or family bleeding history  Psychiatric/Behavioral: Has anxiety or depression     Physical Exam:    BP 107/69 (BP Location: Right Arm, Patient Position: Sitting, Cuff Size: Normal)   Pulse 82   Temp 98.7 F (37.1 C)   Ht 5' (1.524 m)   Wt 137 lb (62.1 kg)   BMI 26.76 kg/m  Constitutional:  Well-developed, in no acute distress. Psychiatric: Normal mood and affect. Behavior is normal. HEENT: Pupils normal.  Conjunctivae are normal. No scleral icterus. Cardiovascular: Normal rate, regular rhythm. No edema Pulmonary/chest: Effort normal and breath sounds normal. No wheezing, rales or rhonchi. Abdominal: Soft, nondistended.  Mild epigastric tenderness, well-healed surgical scars. Bowel sounds active throughout. There are no masses palpable. No hepatomegaly. Rectal:  defered Neurological:  Alert and oriented to person place and time. Skin: Skin is warm and dry. No rashes noted.  Data Reviewed: I have personally reviewed following labs and imaging studies  CBC:    Latest Ref Rng & Units 12/18/2016   11:42 AM 12/24/2007    1:49 PM 12/14/2006   11:53 AM  CBC  WBC 4.0 - 10.5 K/uL 8.0  10.6  8.7   Hemoglobin 12.0 - 15.0 g/dL 88.1  85.5  85.7   Hematocrit 36.0 - 46.0 % 36.2  42.3  41.8   Platelets 150 - 400 K/uL 335  270  297     CMP:    Latest Ref Rng & Units 12/18/2016   11:42 AM  CMP  Glucose 65 - 99 mg/dL 842   BUN 6 - 20 mg/dL 15   Creatinine 9.55 - 1.00 mg/dL 8.42   Sodium 864 - 854 mmol/L 139   Potassium 3.5 - 5.1 mmol/L 4.1   Chloride 101 - 111 mmol/L 103   CO2 22 - 32 mmol/L 27   Calcium 8.9 - 10.3 mg/dL 9.8      Anselm Bring, MD 08/15/2023, 2:41 PM  Cc: Dr Glendia

## 2023-08-15 NOTE — Patient Instructions (Addendum)
 Labs today.  Follow post handout diet.  Resume previous medications.  Take Nexium  20mg  twice daily for 4 weeks, then once a day.    YOU HAD AN ENDOSCOPIC PROCEDURE TODAY AT THE [facility]:   Refer to the procedure report that was given to you for any specific questions about what was found during the examination.  If the procedure report does not answer your questions, please call your gastroenterologist to clarify.  If you requested that your care partner not be given the details of your procedure findings, then the procedure report has been included in a sealed envelope for you to review at your convenience later.  YOU SHOULD EXPECT: Some feelings of bloating in the abdomen. Passage of more gas than usual.  Walking can help get rid of the air that was put into your GI tract during the procedure and reduce the bloating. If you had a lower endoscopy (such as a colonoscopy or flexible sigmoidoscopy) you may notice spotting of blood in your stool or on the toilet paper. If you underwent a bowel prep for your procedure, you may not have a normal bowel movement for a few days.  Please Note:  You might notice some irritation and congestion in your nose or some drainage.  This is from the oxygen used during your procedure.  There is no need for concern and it should clear up in a day or so.  SYMPTOMS TO REPORT IMMEDIATELY:  Following upper endoscopy (EGD)  Vomiting of blood or coffee ground material  New chest pain or pain under the shoulder blades  Painful or persistently difficult swallowing  New shortness of breath  Fever of 100F or higher  Black, tarry-looking stools  For urgent or emergent issues, a gastroenterologist can be reached at any hour by calling [phone]. Do not use MyChart messaging for urgent concerns.    DIET:  Post Dilation Handout.  Drink plenty of fluids but you should avoid alcoholic beverages for 24 hours.  ACTIVITY:  You should plan to take it easy for  the rest of today and you should NOT DRIVE or use heavy machinery until tomorrow (because of the sedation medicines used during the test).    FOLLOW UP: Our staff will call the number listed on your records the next business day following your procedure.  We will call around 7:15- 8:00 am to check on you and address any questions or concerns that you may have regarding the information given to you following your procedure. If we do not reach you, we will leave a message.     If any biopsies were taken you will be contacted by phone or by letter within the next 1-3 weeks.  Please call us  at [phone] if you have not heard about the biopsies in 3 weeks.    SIGNATURES/CONFIDENTIALITY: You and/or your care partner have signed paperwork which will be entered into your electronic medical record.  These signatures attest to the fact that that the information above on your After Visit Summary has been reviewed and is understood.  Full responsibility of the confidentiality of this discharge information lies with you and/or your care-partner.

## 2023-08-15 NOTE — Progress Notes (Signed)
 To pacu, VSS. Report to [email or web address]

## 2023-08-15 NOTE — Progress Notes (Signed)
 Called to room to assist during endoscopic procedure.  Patient ID and intended procedure confirmed with present staff. Received instructions for my participation in the procedure from the performing physician.

## 2023-08-16 ENCOUNTER — Telehealth: Payer: Self-pay | Admitting: *Deleted

## 2023-08-16 DIAGNOSIS — Z1231 Encounter for screening mammogram for malignant neoplasm of breast: Secondary | ICD-10-CM | POA: Diagnosis not present

## 2023-08-16 DIAGNOSIS — C44529 Squamous cell carcinoma of skin of other part of trunk: Secondary | ICD-10-CM | POA: Diagnosis not present

## 2023-08-16 NOTE — Telephone Encounter (Signed)
  Follow up Call-     08/15/2023    2:10 PM  Call back number  Post procedure Call Back phone  # [phone]  Permission to leave phone message Yes     Patient questions:  Do you have a fever, pain , or abdominal swelling? No. Pain Score  0 *  Have you tolerated food without any problems? No.  Have you been able to return to your normal activities? Yes.    Do you have any questions about your discharge instructions: Diet   No. Medications  No. Follow up visit  No.  Do you have questions or concerns about your Care? No.  Actions: * If pain score is 4 or above: No action needed, pain <4.

## 2023-08-18 ENCOUNTER — Ambulatory Visit: Payer: Self-pay | Admitting: Gastroenterology

## 2023-08-20 LAB — SURGICAL PATHOLOGY

## 2023-08-21 DIAGNOSIS — Z01818 Encounter for other preprocedural examination: Secondary | ICD-10-CM | POA: Diagnosis not present

## 2023-08-21 DIAGNOSIS — H25813 Combined forms of age-related cataract, bilateral: Secondary | ICD-10-CM | POA: Diagnosis not present

## 2023-08-21 DIAGNOSIS — H353131 Nonexudative age-related macular degeneration, bilateral, early dry stage: Secondary | ICD-10-CM | POA: Diagnosis not present

## 2023-08-21 DIAGNOSIS — H25811 Combined forms of age-related cataract, right eye: Secondary | ICD-10-CM | POA: Diagnosis not present

## 2023-09-03 DIAGNOSIS — C50412 Malignant neoplasm of upper-outer quadrant of left female breast: Secondary | ICD-10-CM | POA: Diagnosis not present

## 2023-09-03 DIAGNOSIS — Z170 Estrogen receptor positive status [ER+]: Secondary | ICD-10-CM | POA: Diagnosis not present

## 2023-09-04 DIAGNOSIS — Z79899 Other long term (current) drug therapy: Secondary | ICD-10-CM | POA: Diagnosis not present

## 2023-09-04 DIAGNOSIS — H35342 Macular cyst, hole, or pseudohole, left eye: Secondary | ICD-10-CM | POA: Diagnosis not present

## 2023-09-04 DIAGNOSIS — F419 Anxiety disorder, unspecified: Secondary | ICD-10-CM | POA: Diagnosis not present

## 2023-09-04 DIAGNOSIS — F32A Depression, unspecified: Secondary | ICD-10-CM | POA: Diagnosis not present

## 2023-09-04 DIAGNOSIS — H353131 Nonexudative age-related macular degeneration, bilateral, early dry stage: Secondary | ICD-10-CM | POA: Diagnosis not present

## 2023-09-04 DIAGNOSIS — M1990 Unspecified osteoarthritis, unspecified site: Secondary | ICD-10-CM | POA: Diagnosis not present

## 2023-09-04 DIAGNOSIS — E1136 Type 2 diabetes mellitus with diabetic cataract: Secondary | ICD-10-CM | POA: Diagnosis not present

## 2023-09-04 DIAGNOSIS — H25811 Combined forms of age-related cataract, right eye: Secondary | ICD-10-CM | POA: Diagnosis not present

## 2023-09-04 DIAGNOSIS — H259 Unspecified age-related cataract: Secondary | ICD-10-CM | POA: Diagnosis not present

## 2023-09-04 DIAGNOSIS — E7800 Pure hypercholesterolemia, unspecified: Secondary | ICD-10-CM | POA: Diagnosis not present

## 2023-09-04 DIAGNOSIS — H52223 Regular astigmatism, bilateral: Secondary | ICD-10-CM | POA: Diagnosis not present

## 2023-09-04 DIAGNOSIS — K219 Gastro-esophageal reflux disease without esophagitis: Secondary | ICD-10-CM | POA: Diagnosis not present

## 2023-09-04 DIAGNOSIS — G43909 Migraine, unspecified, not intractable, without status migrainosus: Secondary | ICD-10-CM | POA: Diagnosis not present

## 2023-09-04 DIAGNOSIS — Z853 Personal history of malignant neoplasm of breast: Secondary | ICD-10-CM | POA: Diagnosis not present

## 2023-09-04 DIAGNOSIS — I10 Essential (primary) hypertension: Secondary | ICD-10-CM | POA: Diagnosis not present

## 2023-09-04 DIAGNOSIS — R0683 Snoring: Secondary | ICD-10-CM | POA: Diagnosis not present

## 2023-09-18 DIAGNOSIS — H259 Unspecified age-related cataract: Secondary | ICD-10-CM | POA: Diagnosis not present

## 2023-09-18 DIAGNOSIS — Z981 Arthrodesis status: Secondary | ICD-10-CM | POA: Diagnosis not present

## 2023-09-18 DIAGNOSIS — M1990 Unspecified osteoarthritis, unspecified site: Secondary | ICD-10-CM | POA: Diagnosis not present

## 2023-09-18 DIAGNOSIS — F32A Depression, unspecified: Secondary | ICD-10-CM | POA: Diagnosis not present

## 2023-09-18 DIAGNOSIS — G43909 Migraine, unspecified, not intractable, without status migrainosus: Secondary | ICD-10-CM | POA: Diagnosis not present

## 2023-09-18 DIAGNOSIS — Z853 Personal history of malignant neoplasm of breast: Secondary | ICD-10-CM | POA: Diagnosis not present

## 2023-09-18 DIAGNOSIS — K219 Gastro-esophageal reflux disease without esophagitis: Secondary | ICD-10-CM | POA: Diagnosis not present

## 2023-09-18 DIAGNOSIS — E785 Hyperlipidemia, unspecified: Secondary | ICD-10-CM | POA: Diagnosis not present

## 2023-09-18 DIAGNOSIS — E119 Type 2 diabetes mellitus without complications: Secondary | ICD-10-CM | POA: Diagnosis not present

## 2023-09-18 DIAGNOSIS — Z79899 Other long term (current) drug therapy: Secondary | ICD-10-CM | POA: Diagnosis not present

## 2023-09-18 DIAGNOSIS — G4733 Obstructive sleep apnea (adult) (pediatric): Secondary | ICD-10-CM | POA: Diagnosis not present

## 2023-09-18 DIAGNOSIS — H25812 Combined forms of age-related cataract, left eye: Secondary | ICD-10-CM | POA: Diagnosis not present

## 2023-09-18 DIAGNOSIS — F419 Anxiety disorder, unspecified: Secondary | ICD-10-CM | POA: Diagnosis not present

## 2023-09-18 DIAGNOSIS — I10 Essential (primary) hypertension: Secondary | ICD-10-CM | POA: Diagnosis not present

## 2023-12-03 DIAGNOSIS — R8290 Unspecified abnormal findings in urine: Secondary | ICD-10-CM | POA: Diagnosis not present

## 2023-12-03 DIAGNOSIS — Z23 Encounter for immunization: Secondary | ICD-10-CM | POA: Diagnosis not present

## 2023-12-03 DIAGNOSIS — E119 Type 2 diabetes mellitus without complications: Secondary | ICD-10-CM | POA: Diagnosis not present

## 2023-12-03 DIAGNOSIS — G43909 Migraine, unspecified, not intractable, without status migrainosus: Secondary | ICD-10-CM | POA: Diagnosis not present

## 2023-12-03 DIAGNOSIS — R202 Paresthesia of skin: Secondary | ICD-10-CM | POA: Diagnosis not present

## 2023-12-03 DIAGNOSIS — R200 Anesthesia of skin: Secondary | ICD-10-CM | POA: Diagnosis not present

## 2023-12-24 DIAGNOSIS — I10 Essential (primary) hypertension: Secondary | ICD-10-CM | POA: Diagnosis not present

## 2023-12-24 DIAGNOSIS — E1142 Type 2 diabetes mellitus with diabetic polyneuropathy: Secondary | ICD-10-CM | POA: Diagnosis not present

## 2023-12-24 DIAGNOSIS — N1832 Chronic kidney disease, stage 3b: Secondary | ICD-10-CM | POA: Diagnosis not present

## 2023-12-24 DIAGNOSIS — E119 Type 2 diabetes mellitus without complications: Secondary | ICD-10-CM | POA: Diagnosis not present

## 2023-12-27 ENCOUNTER — Telehealth: Payer: Self-pay | Admitting: Pharmacist

## 2023-12-27 NOTE — Progress Notes (Signed)
   12/27/2023 Name: Paula Graves MRN: [id number] DOB: Oct 28, 1950  Chief Complaint  Patient presents with   Medication Management   Diabetes    Paula Graves is a 73 y.o. year old female who presented for a telephone visit.   They were referred to the pharmacist by their PCP for assistance in managing diabetes and medication access.    Subjective:  Care Team: Primary Care Provider: Clemmie Nest, MD ; Next Scheduled Visit: 03/25/2024  Medication Access/Adherence  Current Pharmacy:  [id number] [place], [place] - [address] AT [facility] [address] [place] [address] Phone: [phone] Fax: [phone]   Patient reports affordability concerns with their medications: Yes  Patient reports access/transportation concerns to their pharmacy: No  Patient reports adherence concerns with their medications:  No     Diabetes:  Current medications: None Medications tried in the past: None, cost prohibitive    Macrovascular and Microvascular Risk Reduction:  Statin? yes (simvastatin  40 mg); ACEi/ARB? yes (losartan 25 mg) Last urinary albumin/creatinine ratio:  11/17/202Tobacco Use: NEVER    Assessment/Plan:   Diabetes: - Currently controlled; goal A1c <7%. Cardiorenal risk reduction is opportunities for improvement.. Blood pressure is at goal <130/80. LDL is at goal.  - Reviewed long term cardiovascular and renal outcomes of uncontrolled blood sugar. - Recommend to start Farxiga 10 mg for cardiorenal benefits . - Discussed potential side effects of dehydration, genitourinary infections., Encouraged adequate hydration and genital hygiene.    Will send to Inocente Butcher, CPhT for further coordination of patient assistance application. Will provide patient with samples of Farxiga 10 mg 1 tablet PO daily. Patient has been advised to alert office to any new UTI or yeast signs/symptoms.    Paula Graves, PharmD Clinical Pharmacist [place] Direct Dial: [phone]

## 2023-12-31 ENCOUNTER — Telehealth: Payer: Self-pay

## 2023-12-31 NOTE — Telephone Encounter (Signed)
 PAP: Application for Marcelline Deist has been submitted to [employer] ([employer]), via fax

## 2024-01-07 NOTE — Telephone Encounter (Signed)
 PAP: Patient assistance application for Farxiga has been approved by PAP Companies: [employer] from 01/01/2025 to 02/05/2025. Medication should be delivered to PAP Delivery: Home. For further shipping updates, please contact [employer] at [phone]. Patient ID is: [id number]

## 2024-03-25 ENCOUNTER — Ambulatory Visit: Payer: Medicare Other | Admitting: Oncology
